# Patient Record
Sex: Male | Born: 1985 | Race: Black or African American | Hispanic: No | Marital: Single | Smoking: Current every day smoker
Health system: Southern US, Community
[De-identification: ages and names within clinical notes are randomized; demographics above are authoritative.]

## PROBLEM LIST (undated history)

## (undated) ENCOUNTER — Emergency Department (HOSPITAL_COMMUNITY): Admission: EM | Payer: No Typology Code available for payment source | Source: Home / Self Care

## (undated) DIAGNOSIS — S63509A Unspecified sprain of unspecified wrist, initial encounter: Secondary | ICD-10-CM

## (undated) DIAGNOSIS — J45909 Unspecified asthma, uncomplicated: Secondary | ICD-10-CM

## (undated) DIAGNOSIS — I10 Essential (primary) hypertension: Secondary | ICD-10-CM

## (undated) HISTORY — PX: MOUTH SURGERY: SHX715

---
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
## 2005-02-21 ENCOUNTER — Emergency Department (HOSPITAL_COMMUNITY): Admission: EM | Admit: 2005-02-21 | Discharge: 2005-02-21 | Payer: Self-pay | Admitting: Emergency Medicine

## 2007-01-12 ENCOUNTER — Emergency Department (HOSPITAL_COMMUNITY): Admission: EM | Admit: 2007-01-12 | Discharge: 2007-01-12 | Payer: Self-pay | Admitting: Emergency Medicine

## 2009-03-03 ENCOUNTER — Emergency Department (HOSPITAL_COMMUNITY): Admission: EM | Admit: 2009-03-03 | Discharge: 2009-03-03 | Payer: Self-pay | Admitting: Family Medicine

## 2009-04-04 ENCOUNTER — Emergency Department (HOSPITAL_COMMUNITY): Admission: EM | Admit: 2009-04-04 | Discharge: 2009-04-04 | Payer: Self-pay | Admitting: Family Medicine

## 2009-12-31 ENCOUNTER — Emergency Department (HOSPITAL_COMMUNITY): Admission: EM | Admit: 2009-12-31 | Discharge: 2009-12-31 | Payer: Self-pay | Admitting: Emergency Medicine

## 2010-02-18 ENCOUNTER — Emergency Department (HOSPITAL_COMMUNITY): Admission: EM | Admit: 2010-02-18 | Discharge: 2010-02-18 | Payer: Self-pay | Admitting: Emergency Medicine

## 2010-02-22 ENCOUNTER — Emergency Department (HOSPITAL_COMMUNITY): Admission: EM | Admit: 2010-02-22 | Discharge: 2010-02-23 | Payer: Self-pay | Admitting: Emergency Medicine

## 2010-10-03 ENCOUNTER — Emergency Department (HOSPITAL_COMMUNITY)
Admission: EM | Admit: 2010-10-03 | Discharge: 2010-10-03 | Disposition: A | Discharge status: Home / Self Care | Payer: Self-pay | Attending: Emergency Medicine | Admitting: Emergency Medicine

## 2010-10-03 DIAGNOSIS — R509 Fever, unspecified: Secondary | ICD-10-CM | POA: Insufficient documentation

## 2010-10-03 DIAGNOSIS — J029 Acute pharyngitis, unspecified: Secondary | ICD-10-CM | POA: Insufficient documentation

## 2010-10-13 LAB — DIFFERENTIAL
Basophils Absolute: 0 10*3/uL (ref 0.0–0.1)
Basophils Relative: 1 % (ref 0–1)
Eosinophils Relative: 1 % (ref 0–5)
Lymphs Abs: 1.8 10*3/uL (ref 0.7–4.0)
Monocytes Absolute: 0.7 10*3/uL (ref 0.1–1.0)

## 2010-10-13 LAB — URINE MICROSCOPIC-ADD ON

## 2010-10-13 LAB — URINALYSIS, ROUTINE W REFLEX MICROSCOPIC
Hgb urine dipstick: NEGATIVE
Ketones, ur: 15 mg/dL — AB
Urobilinogen, UA: 1 mg/dL (ref 0.0–1.0)

## 2010-10-13 LAB — CBC
MCH: 28.7 pg (ref 26.0–34.0)
MCHC: 33.3 g/dL (ref 30.0–36.0)
Platelets: 228 10*3/uL (ref 150–400)
RDW: 13.1 % (ref 11.5–15.5)
WBC: 9.3 10*3/uL (ref 4.0–10.5)

## 2010-10-13 LAB — BASIC METABOLIC PANEL
BUN: 9 mg/dL (ref 6–23)
CO2: 24 mEq/L (ref 19–32)
Chloride: 106 mEq/L (ref 96–112)
GFR calc non Af Amer: >60 mL/min (ref >60)
Potassium: 3.5 mEq/L (ref 3.5–5.1)
Sodium: 138 mEq/L (ref 135–145)

## 2010-12-11 ENCOUNTER — Emergency Department (HOSPITAL_COMMUNITY): Payer: Self-pay

## 2010-12-11 ENCOUNTER — Emergency Department (HOSPITAL_COMMUNITY)
Admission: EM | Admit: 2010-12-11 | Discharge: 2010-12-11 | Disposition: A | Discharge status: Home / Self Care | Payer: No Typology Code available for payment source | Attending: Emergency Medicine | Admitting: Emergency Medicine

## 2010-12-11 DIAGNOSIS — M542 Cervicalgia: Secondary | ICD-10-CM | POA: Insufficient documentation

## 2010-12-11 DIAGNOSIS — T148XXA Other injury of unspecified body region, initial encounter: Secondary | ICD-10-CM | POA: Insufficient documentation

## 2010-12-11 DIAGNOSIS — M549 Dorsalgia, unspecified: Secondary | ICD-10-CM | POA: Insufficient documentation

## 2010-12-11 DIAGNOSIS — R079 Chest pain, unspecified: Secondary | ICD-10-CM | POA: Insufficient documentation

## 2010-12-11 DIAGNOSIS — S139XXA Sprain of joints and ligaments of unspecified parts of neck, initial encounter: Secondary | ICD-10-CM | POA: Insufficient documentation

## 2011-03-09 ENCOUNTER — Emergency Department (HOSPITAL_COMMUNITY)
Admission: EM | Admit: 2011-03-09 | Discharge: 2011-03-09 | Disposition: A | Discharge status: Home / Self Care | Payer: Self-pay | Attending: Emergency Medicine | Admitting: Emergency Medicine

## 2011-03-09 ENCOUNTER — Emergency Department (HOSPITAL_COMMUNITY): Payer: Self-pay

## 2011-03-09 DIAGNOSIS — R059 Cough, unspecified: Secondary | ICD-10-CM | POA: Insufficient documentation

## 2011-03-09 DIAGNOSIS — J40 Bronchitis, not specified as acute or chronic: Secondary | ICD-10-CM | POA: Insufficient documentation

## 2011-03-09 DIAGNOSIS — R062 Wheezing: Secondary | ICD-10-CM | POA: Insufficient documentation

## 2011-03-09 DIAGNOSIS — M549 Dorsalgia, unspecified: Secondary | ICD-10-CM | POA: Insufficient documentation

## 2011-03-09 DIAGNOSIS — R0602 Shortness of breath: Secondary | ICD-10-CM | POA: Insufficient documentation

## 2011-03-09 DIAGNOSIS — F17200 Nicotine dependence, unspecified, uncomplicated: Secondary | ICD-10-CM | POA: Insufficient documentation

## 2011-03-09 DIAGNOSIS — R05 Cough: Secondary | ICD-10-CM | POA: Insufficient documentation

## 2011-03-09 DIAGNOSIS — R0609 Other forms of dyspnea: Secondary | ICD-10-CM | POA: Insufficient documentation

## 2011-03-09 DIAGNOSIS — R0989 Other specified symptoms and signs involving the circulatory and respiratory systems: Secondary | ICD-10-CM | POA: Insufficient documentation

## 2011-12-05 ENCOUNTER — Emergency Department (HOSPITAL_COMMUNITY)
Admission: EM | Admit: 2011-12-05 | Discharge: 2011-12-05 | Disposition: A | Discharge status: Home / Self Care | Payer: No Typology Code available for payment source | Attending: Emergency Medicine | Admitting: Emergency Medicine

## 2011-12-05 ENCOUNTER — Encounter (HOSPITAL_COMMUNITY): Payer: Self-pay | Admitting: Emergency Medicine

## 2011-12-05 ENCOUNTER — Emergency Department (HOSPITAL_COMMUNITY): Payer: No Typology Code available for payment source

## 2011-12-05 DIAGNOSIS — M542 Cervicalgia: Secondary | ICD-10-CM | POA: Insufficient documentation

## 2011-12-05 DIAGNOSIS — T1490XA Injury, unspecified, initial encounter: Secondary | ICD-10-CM | POA: Insufficient documentation

## 2011-12-05 DIAGNOSIS — F17200 Nicotine dependence, unspecified, uncomplicated: Secondary | ICD-10-CM | POA: Insufficient documentation

## 2011-12-05 DIAGNOSIS — I10 Essential (primary) hypertension: Secondary | ICD-10-CM | POA: Insufficient documentation

## 2011-12-05 DIAGNOSIS — R4182 Altered mental status, unspecified: Secondary | ICD-10-CM | POA: Insufficient documentation

## 2011-12-05 DIAGNOSIS — R51 Headache: Secondary | ICD-10-CM | POA: Insufficient documentation

## 2011-12-05 DIAGNOSIS — S0190XA Unspecified open wound of unspecified part of head, initial encounter: Secondary | ICD-10-CM | POA: Insufficient documentation

## 2011-12-05 HISTORY — DX: Essential (primary) hypertension: I10

## 2011-12-05 MED ORDER — HYDROMORPHONE HCL PF 1 MG/ML IJ SOLN
0.5000 mg | Freq: Once | INTRAMUSCULAR | Status: AC
  Administered 2011-12-05: 0.5 mg via INTRAVENOUS
  Filled 2011-12-05: qty 1

## 2011-12-05 MED ORDER — ONDANSETRON HCL 4 MG/2ML IJ SOLN
4.0000 mg | Freq: Once | INTRAMUSCULAR | Status: AC
  Administered 2011-12-05: 4 mg via INTRAVENOUS
  Filled 2011-12-05: qty 2

## 2011-12-05 MED ORDER — IBUPROFEN 800 MG PO TABS: 800.0000 mg | ORAL_TABLET | Freq: Three times a day (TID) | ORAL | Status: DC
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
## 2011-12-05 NOTE — ED Notes (Signed)
Pt returns to room 
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
## 2011-12-05 NOTE — ED Notes (Signed)
Dr Jeraldine Loots bedside, back board removed
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
## 2011-12-07 ENCOUNTER — Encounter (HOSPITAL_COMMUNITY): Payer: Self-pay

## 2011-12-07 ENCOUNTER — Emergency Department (HOSPITAL_COMMUNITY)
Admission: EM | Admit: 2011-12-07 | Discharge: 2011-12-07 | Disposition: A | Discharge status: Home / Self Care | Payer: No Typology Code available for payment source | Attending: Emergency Medicine | Admitting: Emergency Medicine

## 2011-12-07 DIAGNOSIS — S134XXA Sprain of ligaments of cervical spine, initial encounter: Secondary | ICD-10-CM

## 2011-12-07 DIAGNOSIS — F0781 Postconcussional syndrome: Secondary | ICD-10-CM

## 2011-12-07 DIAGNOSIS — Y93I9 Activity, other involving external motion: Secondary | ICD-10-CM | POA: Insufficient documentation

## 2011-12-07 DIAGNOSIS — S139XXA Sprain of joints and ligaments of unspecified parts of neck, initial encounter: Secondary | ICD-10-CM | POA: Insufficient documentation

## 2011-12-07 DIAGNOSIS — F17200 Nicotine dependence, unspecified, uncomplicated: Secondary | ICD-10-CM | POA: Insufficient documentation

## 2011-12-07 DIAGNOSIS — M545 Low back pain, unspecified: Secondary | ICD-10-CM | POA: Insufficient documentation

## 2011-12-07 DIAGNOSIS — Y998 Other external cause status: Secondary | ICD-10-CM | POA: Insufficient documentation

## 2011-12-07 DIAGNOSIS — I10 Essential (primary) hypertension: Secondary | ICD-10-CM | POA: Insufficient documentation

## 2011-12-07 MED ORDER — TRAMADOL HCL 50 MG PO TABS: 50.0000 mg | ORAL_TABLET | Freq: Four times a day (QID) | ORAL | Status: AC | PRN

## 2011-12-07 MED ORDER — ONDANSETRON HCL 4 MG PO TABS: 4.0000 mg | ORAL_TABLET | Freq: Four times a day (QID) | ORAL | Status: DC | PRN

## 2011-12-07 MED ORDER — DIAZEPAM 5 MG PO TABS: 5.0000 mg | ORAL_TABLET | Freq: Three times a day (TID) | ORAL | Status: AC | PRN
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
## 2011-12-09 NOTE — ED Provider Notes (Signed)
Medical screening examination/treatment/procedure(s) were performed by non-physician practitioner and as supervising physician I was immediately available for consultation/collaboration.  Quinnetta Roepke, MD 12/09/11 0744 

## 2011-12-12 ENCOUNTER — Encounter (HOSPITAL_COMMUNITY): Payer: Self-pay | Admitting: *Deleted

## 2011-12-12 ENCOUNTER — Emergency Department (HOSPITAL_COMMUNITY)
Admission: EM | Admit: 2011-12-12 | Discharge: 2011-12-12 | Discharge status: Against Medical Advice | Payer: Self-pay | Attending: Emergency Medicine | Admitting: Emergency Medicine

## 2011-12-12 DIAGNOSIS — R51 Headache: Secondary | ICD-10-CM | POA: Insufficient documentation

## 2011-12-12 NOTE — ED Notes (Signed)
Headache since the 9th of may no nv

## 2011-12-12 NOTE — ED Notes (Signed)
Pt left AMA signed form with Diplomatic Services operational officer

## 2012-11-08 ENCOUNTER — Encounter (HOSPITAL_COMMUNITY): Payer: Self-pay | Admitting: Emergency Medicine

## 2012-11-08 ENCOUNTER — Emergency Department (HOSPITAL_COMMUNITY)
Admission: EM | Admit: 2012-11-08 | Discharge: 2012-11-08 | Disposition: A | Discharge status: Home / Self Care | Payer: Self-pay | Attending: Emergency Medicine | Admitting: Emergency Medicine

## 2012-11-08 DIAGNOSIS — I10 Essential (primary) hypertension: Secondary | ICD-10-CM | POA: Insufficient documentation

## 2012-11-08 DIAGNOSIS — W268XXA Contact with other sharp object(s), not elsewhere classified, initial encounter: Secondary | ICD-10-CM | POA: Insufficient documentation

## 2012-11-08 DIAGNOSIS — Y929 Unspecified place or not applicable: Secondary | ICD-10-CM | POA: Insufficient documentation

## 2012-11-08 DIAGNOSIS — Y9389 Activity, other specified: Secondary | ICD-10-CM | POA: Insufficient documentation

## 2012-11-08 DIAGNOSIS — Z23 Encounter for immunization: Secondary | ICD-10-CM | POA: Insufficient documentation

## 2012-11-08 DIAGNOSIS — F17200 Nicotine dependence, unspecified, uncomplicated: Secondary | ICD-10-CM | POA: Insufficient documentation

## 2012-11-08 DIAGNOSIS — S61209A Unspecified open wound of unspecified finger without damage to nail, initial encounter: Secondary | ICD-10-CM | POA: Insufficient documentation

## 2012-11-08 DIAGNOSIS — IMO0002 Reserved for concepts with insufficient information to code with codable children: Secondary | ICD-10-CM

## 2012-11-08 MED ORDER — TETANUS-DIPHTH-ACELL PERTUSSIS 5-2.5-18.5 LF-MCG/0.5 IM SUSP
0.5000 mL | Freq: Once | INTRAMUSCULAR | Status: AC
  Administered 2012-11-08: 0.5 mL via INTRAMUSCULAR
  Filled 2012-11-08: qty 0.5

## 2012-11-08 NOTE — ED Provider Notes (Signed)
History     CSN: [id number]  Arrival date & time 11/08/12  4098   First MD Initiated Contact with Patient 11/08/12 [phone]      Chief Complaint  Patient presents with  . Laceration    (Consider location/radiation/quality/duration/timing/severity/associated sxs/prior treatment) HPI Comments: 27 y/o male presents to the ED with a laceration between his index and middle fingers of right hand after cutting it on his bed rail prior to arrival. States the metal part of his rail is sharp. Currently complaining of 7/10 throbbing pain worsened by separating his fingers. Denies numbness or tingling. Unsure of last tetanus shot.   Patient is a 27 y.o. male presenting with skin laceration. The history is provided by the patient.  Laceration   Past Medical History  Diagnosis Date  . Hypertension     History reviewed. No pertinent past surgical history.  History reviewed. No pertinent family history.  History  Substance Use Topics  . Smoking status: Current Every Day Smoker -- 1.00 packs/day    Types: Cigarettes  . Smokeless tobacco: Not on file  . Alcohol Use: No      Review of Systems  Skin: Positive for wound.  Neurological: Negative for numbness.  All other systems reviewed and are negative.    Allergies  Review of patient's allergies indicates no known allergies.  Home Medications  No current outpatient prescriptions on file.  BP 139/88  Pulse 89  Temp(Src) 98.2 F (36.8 C) (Oral)  Resp 16  SpO2 98%  Physical Exam  Nursing note and vitals reviewed. Constitutional: He is oriented to person, place, and time. He appears well-developed and well-nourished. No distress.  HENT:  Head: Normocephalic and atraumatic.  Eyes: Conjunctivae are normal.  Neck: Normal range of motion. Neck supple.  Cardiovascular: Normal rate, regular rhythm, normal heart sounds and intact distal pulses.   Pulmonary/Chest: Effort normal and breath sounds normal.  Musculoskeletal: Normal range  of motion. He exhibits no edema.  Full ROM of right hand and digits. No evidence of tendinous disruption. Full flexion and extension.   Neurological: He is alert and oriented to person, place, and time. No sensory deficit.  Sensation intact.  Skin: Skin is warm and dry.  1 cm laceration between index and middle finger of right hand. Capillary refill < 3 seconds.   Psychiatric: He has a normal mood and affect. His behavior is normal.    ED Course  Procedures (including critical care time) LACERATION REPAIR Performed by: Johnnette Gourd, Leo Rod PA student Authorized by: Johnnette Gourd Consent: Verbal consent obtained. Risks and benefits: risks, benefits and alternatives were discussed Consent given by: patient Patient identity confirmed: provided demographic data Prepped and Draped in normal sterile fashion Wound explored  Laceration Location: between right index and middle finger  Laceration Length: 1 cm  No Foreign Bodies seen or palpated  Anesthesia: local infiltration  Local anesthetic: lidocaine 2% without epinephrine  Anesthetic total: 2 ml  Irrigation method: syringe Amount of cleaning: standard  Skin closure: 2  Number of sutures: 5-0 prolene  Technique: simple interrupted  Patient tolerance: Patient tolerated the procedure well with no immediate complications.  Labs Reviewed - No data to display No results found.   1. Laceration       MDM  27 y/o male with laceration. Wound care given. 2 sutures placed. Neurovascularly intact. No tendon injury. TDAP given. Return precautions discussed. Patient states understanding of plan and is agreeable.         Nada Boozer  Mathis Fare, PA-C 11/08/12 [phone]

## 2012-11-08 NOTE — ED Notes (Signed)
Right hand cleaned with betadine and NS

## 2012-11-08 NOTE — ED Notes (Signed)
Patient advises that he cut his hand over the bed rail this morning. Laceration noted between the index and middle finger.No active bleeding noted. Positive PMS noted to the hand.

## 2012-11-08 NOTE — ED Provider Notes (Signed)
Medical screening examination/treatment/procedure(s) were performed by non-physician practitioner and as supervising physician I was immediately available for consultation/collaboration.  I was available if needed during laceration repair.  Gavin Pound. Oletta Lamas, MD 11/08/12 1610

## 2012-11-08 NOTE — ED Notes (Signed)
Patient discharged using the teach back method and patient verbalizes an understanding

## 2013-07-17 ENCOUNTER — Emergency Department (HOSPITAL_COMMUNITY)
Admission: EM | Admit: 2013-07-17 | Discharge: 2013-07-17 | Disposition: A | Discharge status: Home / Self Care | Payer: Medicaid Other | Attending: Emergency Medicine | Admitting: Emergency Medicine

## 2013-07-17 ENCOUNTER — Encounter (HOSPITAL_COMMUNITY): Payer: Self-pay | Admitting: Emergency Medicine

## 2013-07-17 DIAGNOSIS — Y939 Activity, unspecified: Secondary | ICD-10-CM | POA: Insufficient documentation

## 2013-07-17 DIAGNOSIS — I10 Essential (primary) hypertension: Secondary | ICD-10-CM | POA: Insufficient documentation

## 2013-07-17 DIAGNOSIS — X500XXA Overexertion from strenuous movement or load, initial encounter: Secondary | ICD-10-CM | POA: Insufficient documentation

## 2013-07-17 DIAGNOSIS — S335XXA Sprain of ligaments of lumbar spine, initial encounter: Secondary | ICD-10-CM | POA: Insufficient documentation

## 2013-07-17 DIAGNOSIS — F17200 Nicotine dependence, unspecified, uncomplicated: Secondary | ICD-10-CM | POA: Insufficient documentation

## 2013-07-17 DIAGNOSIS — Y929 Unspecified place or not applicable: Secondary | ICD-10-CM | POA: Insufficient documentation

## 2013-07-17 DIAGNOSIS — S39012A Strain of muscle, fascia and tendon of lower back, initial encounter: Secondary | ICD-10-CM

## 2013-07-17 MED ORDER — OXYCODONE-ACETAMINOPHEN 5-325 MG PO TABS: 2.0000 | ORAL_TABLET | ORAL | Status: DC | PRN

## 2013-07-17 MED ORDER — CYCLOBENZAPRINE HCL 10 MG PO TABS: 10.0000 mg | ORAL_TABLET | Freq: Two times a day (BID) | ORAL | Status: DC | PRN

## 2013-07-17 MED ORDER — MELOXICAM 7.5 MG PO TABS: 15.0000 mg | ORAL_TABLET | Freq: Every day | ORAL | Status: DC

## 2013-07-17 MED ORDER — OXYCODONE-ACETAMINOPHEN 5-325 MG PO TABS
2.0000 | ORAL_TABLET | Freq: Once | ORAL | Status: AC
  Administered 2013-07-17: 2 via ORAL
  Filled 2013-07-17: qty 2

## 2013-07-17 NOTE — ED Notes (Signed)
Pt c/o lower back pain. Denies trauma. 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

## 2013-07-17 NOTE — ED Provider Notes (Signed)
CSN: [id number]     Arrival date & time 07/17/13  2300 History   First MD Initiated Contact with Patient 07/17/13 2314     Chief Complaint  Patient presents with  . Back Pain   (Consider location/radiation/quality/duration/timing/severity/associated sxs/prior Treatment) HPI Comments: Patient is a 27 year old male with no significant past medical history who presents today for low back pain x2 weeks. Patient states the pain is worse when twisting his back and relieved with rest. He describes the pain as aching and throbbing in nature and states it is nonradiating. He denies any trauma or injury to his back as well as any associated fever, abdominal pain, vomiting, urinary symptoms, bowel or bladder incontinence, saddle anesthesia, perianal numbness, inability to ambulate, or weakness. Patient states that he does frequent heavy lifting as he works at [employer].  Patient is a 27 y.o. male presenting with back pain. The history is provided by the patient. No language interpreter was used.  Back Pain   Past Medical History  Diagnosis Date  . Hypertension    History reviewed. No pertinent past surgical history. History reviewed. No pertinent family history. History  Substance Use Topics  . Smoking status: Current Every Day Smoker -- 1.00 packs/day    Types: Cigarettes  . Smokeless tobacco: Never Used  . Alcohol Use: No    Review of Systems  Musculoskeletal: Positive for back pain.  All other systems reviewed and are negative.    Allergies  Review of patient's allergies indicates no known allergies.  Home Medications   Current Outpatient Rx  Name  Route  Sig  Dispense  Refill  . ibuprofen (ADVIL,MOTRIN) 200 MG tablet   Oral   Take 200 mg by mouth every 6 (six) hours as needed for mild pain.         . cyclobenzaprine (FLEXERIL) 10 MG tablet   Oral   Take 1 tablet (10 mg total) by mouth 2 (two) times daily as needed for muscle spasms.   20 tablet   0   . meloxicam (MOBIC) 7.5  MG tablet   Oral   Take 2 tablets (15 mg total) by mouth daily.   30 tablet   0   . oxyCODONE-acetaminophen (PERCOCET/ROXICET) 5-325 MG per tablet   Oral   Take 2 tablets by mouth every 4 (four) hours as needed for severe pain.   6 tablet   0    BP 144/71  Pulse 89  Temp(Src) 98.4 F (36.9 C) (Oral)  Resp 18  Ht 5\' 8"  (1.727 m)  Wt 177 lb 3.2 oz (80.377 kg)  BMI 26.95 kg/m2  SpO2 96%  Physical Exam  Nursing note and vitals reviewed. Constitutional: He is oriented to person, place, and time. He appears well-developed and well-nourished. No distress.  HENT:  Head: Normocephalic and atraumatic.  Eyes: Conjunctivae and EOM are normal. No scleral icterus.  Neck: Normal range of motion.  Pulmonary/Chest: Effort normal. No respiratory distress.  Musculoskeletal: Normal range of motion.  Tenderness to palpation of the bilateral lumbar paraspinal muscles. No midline tenderness to palpation of the thoracic or lumbosacral spine. No bony deformities or step-offs palpated. Normal range of motion of the back with flexion, extension, and twisting.  Neurological: He is alert and oriented to person, place, and time. He has normal reflexes.  No gross sensory deficits appreciated. Patient ambulatory with normal gait. Patellar and Achilles reflexes 2+ bilaterally.  Skin: Skin is warm and dry. No rash noted. He is not diaphoretic. No erythema. No  pallor.  Psychiatric: He has a normal mood and affect. His behavior is normal.    ED Course  Procedures (including critical care time) Labs Review Labs Reviewed - No data to display Imaging Review No results found.  EKG Interpretation   None       MDM   1. Low back strain, initial encounter    Uncomplicated low-back strain. Patient well and nontoxic appearing, hemodynamically stable, and afebrile. He denies any trauma or injury to his back inciting his pain. Physical exam findings as above. Patient neurovascularly intact and ambulatory  with normal gait. No reflexive signs concerning for cauda equina. No history of cancer or IV drug use. Do not believe emergent imaging is indicated at this time. Patient stable and appropriate for discharge with prescriptions for Flexeril and Mobic. Ice to the affected area advised. Return precautions discussed and patient agreeable to plan with no unaddressed concerns.    Antony Madura, [place] 07/17/13 2335

## 2013-07-18 NOTE — ED Provider Notes (Signed)
Medical screening examination/treatment/procedure(s) were performed by non-physician practitioner and as supervising physician I was immediately available for consultation/collaboration.  EKG Interpretation   None         Brandt Loosen, MD 07/18/13 [phone]

## 2013-11-03 ENCOUNTER — Emergency Department (HOSPITAL_COMMUNITY)
Admission: EM | Admit: 2013-11-03 | Discharge: 2013-11-03 | Disposition: A | Discharge status: Home / Self Care | Payer: Medicaid Other | Attending: Emergency Medicine | Admitting: Emergency Medicine

## 2013-11-03 DIAGNOSIS — R51 Headache: Secondary | ICD-10-CM | POA: Insufficient documentation

## 2013-11-03 DIAGNOSIS — T85848A Pain due to other internal prosthetic devices, implants and grafts, initial encounter: Secondary | ICD-10-CM

## 2013-11-03 DIAGNOSIS — F17200 Nicotine dependence, unspecified, uncomplicated: Secondary | ICD-10-CM | POA: Insufficient documentation

## 2013-11-03 DIAGNOSIS — Z792 Long term (current) use of antibiotics: Secondary | ICD-10-CM | POA: Insufficient documentation

## 2013-11-03 DIAGNOSIS — G8918 Other acute postprocedural pain: Secondary | ICD-10-CM | POA: Insufficient documentation

## 2013-11-03 DIAGNOSIS — I10 Essential (primary) hypertension: Secondary | ICD-10-CM | POA: Insufficient documentation

## 2013-11-03 DIAGNOSIS — G44209 Tension-type headache, unspecified, not intractable: Secondary | ICD-10-CM

## 2013-11-03 LAB — BASIC METABOLIC PANEL
BUN: 12 mg/dL (ref 6–23)
CO2: 22 mEq/L (ref 19–32)
CREATININE: 1.13 mg/dL (ref 0.50–1.35)
Calcium: 9.1 mg/dL (ref 8.4–10.5)
Chloride: 105 mEq/L (ref 96–112)
GFR calc Af Amer: >90 mL/min (ref >90)
GFR, EST NON AFRICAN AMERICAN: 88 mL/min — AB (ref >90)
Glucose, Bld: 87 mg/dL (ref 70–99)
Potassium: 4.3 mEq/L (ref 3.7–5.3)
SODIUM: 140 meq/L (ref 137–147)

## 2013-11-03 LAB — CBC WITH DIFFERENTIAL/PLATELET
BASOS ABS: 0 10*3/uL (ref 0.0–0.1)
Basophils Relative: 0 % (ref 0–1)
EOS ABS: 0.1 10*3/uL (ref 0.0–0.7)
Eosinophils Relative: 1 % (ref 0–5)
HEMATOCRIT: 43.5 % (ref 39.0–52.0)
Hemoglobin: 14.6 g/dL (ref 13.0–17.0)
Lymphocytes Relative: 33 % (ref 12–46)
Lymphs Abs: 4.7 10*3/uL — ABNORMAL HIGH (ref 0.7–4.0)
MCH: 28.7 pg (ref 26.0–34.0)
MCHC: 33.6 g/dL (ref 30.0–36.0)
MCV: 85.6 fL (ref 78.0–100.0)
MONO ABS: 1.1 10*3/uL — AB (ref 0.1–1.0)
Monocytes Relative: 8 % (ref 3–12)
Neutro Abs: 8.2 10*3/uL — ABNORMAL HIGH (ref 1.7–7.7)
Neutrophils Relative %: 58 % (ref 43–77)
PLATELETS: 283 10*3/uL (ref 150–400)
RBC: 5.08 MIL/uL (ref 4.22–5.81)
RDW: 12.6 % (ref 11.5–15.5)
WBC: 14.1 10*3/uL — ABNORMAL HIGH (ref 4.0–10.5)

## 2013-11-03 MED ORDER — NAPROXEN 500 MG PO TABS: 500.0000 mg | ORAL_TABLET | Freq: Two times a day (BID) | ORAL | Status: DC

## 2013-11-03 MED ORDER — DIPHENHYDRAMINE HCL 50 MG/ML IJ SOLN
25.0000 mg | Freq: Once | INTRAMUSCULAR | Status: AC
  Administered 2013-11-03: 25 mg via INTRAVENOUS
  Filled 2013-11-03: qty 1

## 2013-11-03 MED ORDER — METOCLOPRAMIDE HCL 5 MG/ML IJ SOLN
10.0000 mg | Freq: Once | INTRAMUSCULAR | Status: AC
  Administered 2013-11-03: 10 mg via INTRAVENOUS
  Filled 2013-11-03: qty 2

## 2013-11-03 MED ORDER — KETOROLAC TROMETHAMINE 30 MG/ML IJ SOLN
30.0000 mg | Freq: Once | INTRAMUSCULAR | Status: AC
  Administered 2013-11-03: 30 mg via INTRAVENOUS
  Filled 2013-11-03: qty 1

## 2013-11-03 MED ORDER — METOCLOPRAMIDE HCL 10 MG PO TABS: 10.0000 mg | ORAL_TABLET | Freq: Four times a day (QID) | ORAL | Status: DC | PRN

## 2013-11-03 MED ORDER — SODIUM CHLORIDE 0.9 % IV SOLN
1000.0000 mL | INTRAVENOUS | Status: DC
  Administered 2013-11-03: 1000 mL via INTRAVENOUS

## 2013-11-03 MED ORDER — SODIUM CHLORIDE 0.9 % IV SOLN
1000.0000 mL | Freq: Once | INTRAVENOUS | Status: AC
  Administered 2013-11-03: 1000 mL via INTRAVENOUS

## 2013-11-03 NOTE — ED Provider Notes (Signed)
CSN: [id number]     Arrival date & time 11/03/13  0321 History   First MD Initiated Contact with Patient 11/03/13 [phone]     Chief Complaint  Patient presents with  . Dental Pain     (Consider location/radiation/quality/duration/timing/severity/associated sxs/prior Treatment) Patient is a 28 y.o. male presenting with tooth pain. The history is provided by the patient.  Dental Pain He had 4 wisdom teeth and 2 other teeth extracted 2 days ago. He was discharged with a prescription for hydrocodone and acetaminophen and amoxicillin. He is complaining of pain in his mouth that the pain medication does not help. It makes him sleepy but is still in pain. He is also complaining of a headache. Pain is rated at 10/10. There is no fever or chills but he has broken out in sweats. There is no nausea or vomiting. He denies any vision change.  Past Medical History  Diagnosis Date  . Hypertension    No past surgical history on file. No family history on file. History  Substance Use Topics  . Smoking status: Current Every Day Smoker -- 1.00 packs/day    Types: Cigarettes  . Smokeless tobacco: Never Used  . Alcohol Use: No    Review of Systems  All other systems reviewed and are negative.     Allergies  Review of patient's allergies indicates no known allergies.  Home Medications   Current Outpatient Rx  Name  Route  Sig  Dispense  Refill  . amoxicillin (AMOXIL) 500 MG tablet   Oral   Take 500 mg by mouth 3 (three) times daily. For wisdom teeth extraction         . HYDROcodone-acetaminophen (NORCO) 10-325 MG per tablet   Oral   Take 1 tablet by mouth every 4 (four) hours as needed for moderate pain or severe pain (dental extractions).          There were no vitals taken for this visit. Physical Exam  Nursing note and vitals reviewed.  28 year old male, who appears uncomfortable, but is in no acute distress. Vital signs are normal. Oxygen saturation is 97%, which is normal. Head  is normocephalic and atraumatic. PERRLA, EOMI. Oropharynx is clear. Teeth #1, 3, 16, 17, 19, 32 have been recently extracted. There is slight bleeding from the socket of tooth #19. None appear clinically infected. Neck has marked spasm of sternocleidomastoid muscles and paracervical muscles. These are mildly tender. However, neck is still supple without adenopathy or JVD. Back is nontender and there is no CVA tenderness. Lungs are clear without rales, wheezes, or rhonchi. Chest is nontender. Heart has regular rate and rhythm without murmur. Abdomen is soft, flat, nontender without masses or hepatosplenomegaly and peristalsis is normoactive. Extremities have no cyanosis or edema, full range of motion is present. Skin is warm and dry without rash. Neurologic: Mental status is normal, cranial nerves are intact, there are no motor or sensory deficits.  ED Course  Procedures (including critical care time) Labs Review Results for orders placed during the hospital encounter of 11/03/13  CBC WITH DIFFERENTIAL      Result Value Ref Range   WBC 14.1 (*) 4.0 - 10.5 K/uL   RBC 5.08  4.22 - 5.81 MIL/uL   Hemoglobin 14.6  13.0 - 17.0 g/dL   HCT 11.9  14.7 - 82.9 %   MCV 85.6  78.0 - 100.0 fL   MCH 28.7  26.0 - 34.0 pg   MCHC 33.6  30.0 - 36.0 g/dL  RDW 12.6  11.5 - 15.5 %   Platelets 283  150 - 400 K/uL   Neutrophils Relative % 58  43 - 77 %   Neutro Abs 8.2 (*) 1.7 - 7.7 K/uL   Lymphocytes Relative 33  12 - 46 %   Lymphs Abs 4.7 (*) 0.7 - 4.0 K/uL   Monocytes Relative 8  3 - 12 %   Monocytes Absolute 1.1 (*) 0.1 - 1.0 K/uL   Eosinophils Relative 1  0 - 5 %   Eosinophils Absolute 0.1  0.0 - 0.7 K/uL   Basophils Relative 0  0 - 1 %   Basophils Absolute 0.0  0.0 - 0.1 K/uL  BASIC METABOLIC PANEL      Result Value Ref Range   Sodium 140  137 - 147 mEq/L   Potassium 4.3  3.7 - 5.3 mEq/L   Chloride 105  96 - 112 mEq/L   CO2 22  19 - 32 mEq/L   Glucose, Bld 87  70 - 99 mg/dL   BUN 12  6 - 23  mg/dL   Creatinine, Ser 3.081.13  0.50 - 1.35 mg/dL   Calcium 9.1  8.4 - 65.710.5 mg/dL   GFR calc non Af Amer 88 (*) >90 mL/min   GFR calc Af Amer >90  >90 mL/min   MDM   Final diagnoses:  Dental implant pain  Muscle contraction headache    Dental pain post extraction. No evidence of infection. Headache is at least partially in muscle contraction headache. He'll be given IV fluids and IV metoclopramide and diphenhydramine. He also be given IV ketorolac.  He feels much better after above noted treatment. WBC is mildly elevated but without left shift. There is no evidence of serious infection. This was explained to the patient. He is discharged with prescription for naproxen and metoclopramide. He is to continue taking his antibiotic and followup with his dentist.  Dione Boozeavid Lexandra Rettke, MD 11/03/13 [phone]

## 2013-11-03 NOTE — ED Notes (Signed)
Patient arrives with Dental pain related tooth removal. Patient had 2 molar teeth and all 4 wisdom teeth removed on 11/01/2013. Has been taking vicodin as prescribed, but medicine hasn't been helping. Also currently taking amoxicillin.

## 2013-11-03 NOTE — ED Notes (Signed)
See downtime paperwork for additional details about Triage.

## 2013-11-03 NOTE — Discharge Instructions (Signed)
Follow up with the dentist. Continue taking your antibiotic. Use ice and/or heat to the muscles in your neck if they go into spasm.   Tension Headache A tension headache is a feeling of pain, pressure, or aching often felt over the front and sides of the head. The pain can be dull or can feel tight (constricting). It is the most common type of headache. Tension headaches are not normally associated with nausea or vomiting and do not get worse with physical activity. Tension headaches can last 30 minutes to several days.  CAUSES  The exact cause is not known, but it may be caused by chemicals and hormones in the brain that lead to pain. Tension headaches often begin after stress, anxiety, or depression. Other triggers may include:  Alcohol.  Caffeine (too much or withdrawal).  Respiratory infections (colds, flu, sinus infections).  Dental problems or teeth clenching.  Fatigue.  Holding your head and neck in one position too long while using a computer. SYMPTOMS   Pressure around the head.   Dull, aching head pain.   Pain felt over the front and sides of the head.   Tenderness in the muscles of the head, neck, and shoulders. DIAGNOSIS  A tension headache is often diagnosed based on:   Symptoms.   Physical examination.   A CT scan or MRI of your head. These tests may be ordered if symptoms are severe or unusual. TREATMENT  Medicines may be given to help relieve symptoms.  HOME CARE INSTRUCTIONS   Only take over-the-counter or prescription medicines for pain or discomfort as directed by your caregiver.   Lie down in a dark, quiet room when you have a headache.   Keep a journal to find out what may be triggering your headaches. For example, write down:  What you eat and drink.  How much sleep you get.  Any change to your diet or medicines.  Try massage or other relaxation techniques.   Ice packs or heat applied to the head and neck can be used. Use these 3 to  4 times per day for 15 to 20 minutes each time, or as needed.   Limit stress.   Sit up straight, and do not tense your muscles.   Quit smoking if you smoke.  Limit alcohol use.  Decrease the amount of caffeine you drink, or stop drinking caffeine.  Eat and exercise regularly.  Get 7 to 9 hours of sleep, or as recommended by your caregiver.  Avoid excessive use of pain medicine as recurrent headaches can occur.  SEEK MEDICAL CARE IF:   You have problems with the medicines you were prescribed.  Your medicines do not work.  You have a change from the usual headache.  You have nausea or vomiting. SEEK IMMEDIATE MEDICAL CARE IF:   Your headache becomes severe.  You have a fever.  You have a stiff neck.  You have loss of vision.  You have muscular weakness or loss of muscle control.  You lose your balance or have trouble walking.  You feel faint or pass out.  You have severe symptoms that are different from your first symptoms. MAKE SURE YOU:   Understand these instructions.  Will watch your condition.  Will get help right away if you are not doing well or get worse. Document Released: 07/15/2005 Document Revised: 10/07/2011 Document Reviewed: 07/05/2011 [facility] Patient Information 2014 [place], [place].  Metoclopramide tablets What is this medicine? METOCLOPRAMIDE (met oh kloe PRA mide) is used to  treat the symptoms of gastroesophageal reflux disease (GERD) like heartburn. It is also used to treat people with slow emptying of the stomach and intestinal tract. This medicine may be used for other purposes; ask your health care provider or pharmacist if you have questions. COMMON BRAND NAME(S): Reglan What should I tell my health care provider before I take this medicine? They need to know if you have any of these conditions: -breast cancer -depression -diabetes -heart failure -high blood pressure -kidney disease -liver disease -Parkinson's disease or a  movement disorder -pheochromocytoma -seizures -stomach obstruction, bleeding, or perforation -an unusual or allergic reaction to metoclopramide, procainamide, sulfites, other medicines, foods, dyes, or preservatives -pregnant or trying to get pregnant -breast-feeding How should I use this medicine? Take this medicine by mouth with a glass of water. Follow the directions on the prescription label. Take this medicine on an empty stomach, about 30 minutes before eating. Take your doses at regular intervals. Do not take your medicine more often than directed. Do not stop taking except on the advice of your doctor or health care professional. A special MedGuide will be given to you by the pharmacist with each prescription and refill. Be sure to read this information carefully each time. Talk to your pediatrician regarding the use of this medicine in children. Special care may be needed. Overdosage: If you think you have taken too much of this medicine contact a poison control center or emergency room at once. NOTE: This medicine is only for you. Do not share this medicine with others. What if I miss a dose? If you miss a dose, take it as soon as you can. If it is almost time for your next dose, take only that dose. Do not take double or extra doses. What may interact with this medicine? -acetaminophen -cyclosporine -digoxin -medicines for blood pressure -medicines for diabetes, including insulin -medicines for hay fever and other allergies -medicines for depression, especially an Monoamine Oxidase Inhibitor (MAOI) -medicines for Parkinson's disease, like levodopa -medicines for sleep or for pain -tetracycline This list may not describe all possible interactions. Give your health care provider a list of all the medicines, herbs, non-prescription drugs, or dietary supplements you use. Also tell them if you smoke, drink alcohol, or use illegal drugs. Some items may interact with your  medicine. What should I watch for while using this medicine? It may take a few weeks for your stomach condition to start to get better. However, do not take this medicine for longer than 12 weeks. The longer you take this medicine, and the more you take it, the greater your chances are of developing serious side effects. If you are an elderly patient, a male patient, or you have diabetes, you may be at an increased risk for side effects from this medicine. Contact your doctor immediately if you start having movements you cannot control such as lip smacking, rapid movements of the tongue, involuntary or uncontrollable movements of the eyes, head, arms and legs, or muscle twitches and spasms. Patients and their families should watch out for worsening depression or thoughts of suicide. Also watch out for any sudden or severe changes in feelings such as feeling anxious, agitated, panicky, irritable, hostile, aggressive, impulsive, severely restless, overly excited and hyperactive, or not being able to sleep. If this happens, especially at the beginning of treatment or after a change in dose, call your doctor. Do not treat yourself for high fever. Ask your doctor or health care professional for advice. You may  get drowsy or dizzy. Do not drive, use machinery, or do anything that needs mental alertness until you know how this drug affects you. Do not stand or sit up quickly, especially if you are an older patient. This reduces the risk of dizzy or fainting spells. Alcohol can make you more drowsy and dizzy. Avoid alcoholic drinks. What side effects may I notice from receiving this medicine? Side effects that you should report to your doctor or health care professional as soon as possible: -allergic reactions like skin rash, itching or hives, swelling of the face, lips, or tongue -abnormal production of milk in females -breast enlargement in both males and females -change in the way you walk -difficulty  moving, speaking or swallowing -drooling, lip smacking, or rapid movements of the tongue -excessive sweating -fever -involuntary or uncontrollable movements of the eyes, head, arms and legs -irregular heartbeat or palpitations -muscle twitches and spasms -unusually weak or tired Side effects that usually do not require medical attention (report to your doctor or health care professional if they continue or are bothersome): -change in sex drive or performance -depressed mood -diarrhea -difficulty sleeping -headache -menstrual changes -restless or nervous This list may not describe all possible side effects. Call your doctor for medical advice about side effects. You may report side effects to FDA at 1-800-FDA-1088. Where should I keep my medicine? Keep out of the reach of children. Store at room temperature between 20 and 25 degrees C (68 and 77 degrees F). Protect from light. Keep container tightly closed. Throw away any unused medicine after the expiration date. NOTE: This sheet is a summary. It may not cover all possible information. If you have questions about this medicine, talk to your doctor, pharmacist, or health care provider.  2014, Elsevier/Gold Standard. (2011-11-12 13:04:38)  Naproxen and naproxen sodium oral immediate-release tablets What is this medicine? NAPROXEN (na PROX en) is a non-steroidal anti-inflammatory drug (NSAID). It is used to reduce swelling and to treat pain. This medicine may be used for dental pain, headache, or painful monthly periods. It is also used for painful joint and muscular problems such as arthritis, tendinitis, bursitis, and gout. This medicine may be used for other purposes; ask your health care provider or pharmacist if you have questions. COMMON BRAND NAME(S): Aflaxen, Aleve Arthritis, Aleve, All Day Relief, Anaprox DS, Anaprox, Naprosyn What should I tell my health care provider before I take this medicine? They need to know if you have any  of these conditions: -asthma -cigarette smoker -drink more than 3 alcohol containing drinks a day -heart disease or circulation problems such as heart failure or leg edema (fluid retention) -high blood pressure -kidney disease -liver disease -stomach bleeding or ulcers -an unusual or allergic reaction to naproxen, aspirin, other NSAIDs, other medicines, foods, dyes, or preservatives -pregnant or trying to get pregnant -breast-feeding How should I use this medicine? Take this medicine by mouth with a glass of water. Follow the directions on the prescription label. Take it with food if your stomach gets upset. Try to not lie down for at least 10 minutes after you take it. Take your medicine at regular intervals. Do not take your medicine more often than directed. Long-term, continuous use may increase the risk of heart attack or stroke. A special MedGuide will be given to you by the pharmacist with each prescription and refill. Be sure to read this information carefully each time. Talk to your pediatrician regarding the use of this medicine in children. Special care may be  needed. Overdosage: If you think you have taken too much of this medicine contact a poison control center or emergency room at once. NOTE: This medicine is only for you. Do not share this medicine with others. What if I miss a dose? If you miss a dose, take it as soon as you can. If it is almost time for your next dose, take only that dose. Do not take double or extra doses. What may interact with this medicine? -alcohol -aspirin -cidofovir -diuretics -lithium -methotrexate -other drugs for inflammation like ketorolac or prednisone -pemetrexed -probenecid -warfarin This list may not describe all possible interactions. Give your health care provider a list of all the medicines, herbs, non-prescription drugs, or dietary supplements you use. Also tell them if you smoke, drink alcohol, or use illegal drugs. Some items may  interact with your medicine. What should I watch for while using this medicine? Tell your doctor or health care professional if your pain does not get better. Talk to your doctor before taking another medicine for pain. Do not treat yourself. This medicine does not prevent heart attack or stroke. In fact, this medicine may increase the chance of a heart attack or stroke. The chance may increase with longer use of this medicine and in people who have heart disease. If you take aspirin to prevent heart attack or stroke, talk with your doctor or health care professional. Do not take other medicines that contain aspirin, ibuprofen, or naproxen with this medicine. Side effects such as stomach upset, nausea, or ulcers may be more likely to occur. Many medicines available without a prescription should not be taken with this medicine. This medicine can cause ulcers and bleeding in the stomach and intestines at any time during treatment. Do not smoke cigarettes or drink alcohol. These increase irritation to your stomach and can make it more susceptible to damage from this medicine. Ulcers and bleeding can happen without warning symptoms and can cause death. You may get drowsy or dizzy. Do not drive, use machinery, or do anything that needs mental alertness until you know how this medicine affects you. Do not stand or sit up quickly, especially if you are an older patient. This reduces the risk of dizzy or fainting spells. This medicine can cause you to bleed more easily. Try to avoid damage to your teeth and gums when you brush or floss your teeth. What side effects may I notice from receiving this medicine? Side effects that you should report to your doctor or health care professional as soon as possible: -black or bloody stools, blood in the urine or vomit -blurred vision -chest pain -difficulty breathing or wheezing -nausea or vomiting -severe stomach pain -skin rash, skin redness, blistering or peeling  skin, hives, or itching -slurred speech or weakness on one side of the body -swelling of eyelids, throat, lips -unexplained weight gain or swelling -unusually weak or tired -yellowing of eyes or skin Side effects that usually do not require medical attention (report to your doctor or health care professional if they continue or are bothersome): -constipation -headache -heartburn This list may not describe all possible side effects. Call your doctor for medical advice about side effects. You may report side effects to FDA at 1-800-FDA-1088. Where should I keep my medicine? Keep out of the reach of children. Store at room temperature between 15 and 30 degrees C (59 and 86 degrees F). Keep container tightly closed. Throw away any unused medicine after the expiration date. NOTE: This sheet is a  summary. It may not cover all possible information. If you have questions about this medicine, talk to your doctor, pharmacist, or health care provider.  2014, Elsevier/Gold Standard. (2009-07-17 20:10:16)

## 2013-11-13 ENCOUNTER — Emergency Department (HOSPITAL_COMMUNITY)
Admission: EM | Admit: 2013-11-13 | Discharge: 2013-11-13 | Disposition: A | Discharge status: Home / Self Care | Payer: Medicaid Other | Attending: Emergency Medicine | Admitting: Emergency Medicine

## 2013-11-13 ENCOUNTER — Encounter (HOSPITAL_COMMUNITY): Payer: Self-pay | Admitting: Emergency Medicine

## 2013-11-13 DIAGNOSIS — Z792 Long term (current) use of antibiotics: Secondary | ICD-10-CM | POA: Insufficient documentation

## 2013-11-13 DIAGNOSIS — Z791 Long term (current) use of non-steroidal anti-inflammatories (NSAID): Secondary | ICD-10-CM | POA: Insufficient documentation

## 2013-11-13 DIAGNOSIS — G8911 Acute pain due to trauma: Secondary | ICD-10-CM | POA: Insufficient documentation

## 2013-11-13 DIAGNOSIS — M545 Low back pain, unspecified: Secondary | ICD-10-CM | POA: Insufficient documentation

## 2013-11-13 DIAGNOSIS — K0889 Other specified disorders of teeth and supporting structures: Secondary | ICD-10-CM

## 2013-11-13 DIAGNOSIS — I10 Essential (primary) hypertension: Secondary | ICD-10-CM | POA: Insufficient documentation

## 2013-11-13 DIAGNOSIS — G8929 Other chronic pain: Secondary | ICD-10-CM | POA: Insufficient documentation

## 2013-11-13 DIAGNOSIS — F17200 Nicotine dependence, unspecified, uncomplicated: Secondary | ICD-10-CM | POA: Insufficient documentation

## 2013-11-13 DIAGNOSIS — R51 Headache: Secondary | ICD-10-CM | POA: Insufficient documentation

## 2013-11-13 DIAGNOSIS — K089 Disorder of teeth and supporting structures, unspecified: Secondary | ICD-10-CM | POA: Insufficient documentation

## 2013-11-13 MED ORDER — METHOCARBAMOL 500 MG PO TABS
500.0000 mg | ORAL_TABLET | Freq: Once | ORAL | Status: AC
  Administered 2013-11-13: 500 mg via ORAL
  Filled 2013-11-13: qty 1

## 2013-11-13 MED ORDER — KETOROLAC TROMETHAMINE 60 MG/2ML IM SOLN
60.0000 mg | Freq: Once | INTRAMUSCULAR | Status: AC
  Administered 2013-11-13: 60 mg via INTRAMUSCULAR
  Filled 2013-11-13: qty 2

## 2013-11-13 MED ORDER — HYDROCODONE-ACETAMINOPHEN 5-325 MG PO TABS: 1.0000 | ORAL_TABLET | ORAL | Status: DC | PRN

## 2013-11-13 MED ORDER — METHOCARBAMOL 500 MG PO TABS: 500.0000 mg | ORAL_TABLET | Freq: Three times a day (TID) | ORAL | Status: DC | PRN

## 2013-11-13 MED ORDER — IBUPROFEN 600 MG PO TABS: 600.0000 mg | ORAL_TABLET | Freq: Four times a day (QID) | ORAL | Status: DC | PRN

## 2013-11-13 NOTE — ED Notes (Signed)
Pt c/o mouth pain from oral surgery 4/6. Pt also c/o chronic back pain

## 2013-11-13 NOTE — Discharge Instructions (Signed)
Chronic Back Pain  When back pain lasts longer than 3 months, it is called chronic back pain.People with chronic back pain often go through certain periods that are more intense (flare-ups).  CAUSES Chronic back pain can be caused by wear and tear (degeneration) on different structures in your back. These structures include:  The bones of your spine (vertebrae) and the joints surrounding your spinal cord and nerve roots (facets).  The strong, fibrous tissues that connect your vertebrae (ligaments). Degeneration of these structures may result in pressure on your nerves. This can lead to constant pain. HOME CARE INSTRUCTIONS  Avoid bending, heavy lifting, prolonged sitting, and activities which make the problem worse.  Take brief periods of rest throughout the day to reduce your pain. Lying down or standing usually is better than sitting while you are resting.  Take over-the-counter or prescription medicines only as directed by your caregiver. SEEK IMMEDIATE MEDICAL CARE IF:   You have weakness or numbness in one of your legs or feet.  You have trouble controlling your bladder or bowels.  You have nausea, vomiting, abdominal pain, shortness of breath, or fainting. Document Released: 08/22/2004 Document Revised: 10/07/2011 Document Reviewed: 06/29/2011 [facility] Patient Information 2014 [place], [place].  Dental Pain A tooth ache may be caused by cavities (tooth decay). Cavities expose the nerve of the tooth to air and hot or cold temperatures. It may come from an infection or abscess (also called a boil or furuncle) around your tooth. It is also often caused by dental caries (tooth decay). This causes the pain you are having. DIAGNOSIS  Your caregiver can diagnose this problem by exam. TREATMENT   If caused by an infection, it may be treated with medications which kill germs (antibiotics) and pain medications as prescribed by your caregiver. Take medications as directed.  Only take  over-the-counter or prescription medicines for pain, discomfort, or fever as directed by your caregiver.  Whether the tooth ache today is caused by infection or dental disease, you should see your dentist as soon as possible for further care. SEEK MEDICAL CARE IF: The exam and treatment you received today has been provided on an emergency basis only. This is not a substitute for complete medical or dental care. If your problem worsens or new problems (symptoms) appear, and you are unable to meet with your dentist, call or return to this location. SEEK IMMEDIATE MEDICAL CARE IF:   You have a fever.  You develop redness and swelling of your face, jaw, or neck.  You are unable to open your mouth.  You have severe pain uncontrolled by pain medicine. MAKE SURE YOU:   Understand these instructions.  Will watch your condition.  Will get help right away if you are not doing well or get worse. Document Released: 07/15/2005 Document Revised: 10/07/2011 Document Reviewed: 03/02/2008 [facility] Patient Information 2014 [place], [place].

## 2013-11-13 NOTE — ED Provider Notes (Signed)
CSN: [id number]     Arrival date & time 11/13/13  0223 History   First MD Initiated Contact with Patient 11/13/13 0255     Chief Complaint  Patient presents with  . Jaw Pain     (Consider location/radiation/quality/duration/timing/severity/associated sxs/prior Treatment) HPI Patient had multiple teeth extracted on 4/6. He's had ongoing pain bedside extraction since. He's had no fever or chills. He denies any intraoral swelling. Patient is yet to followup with his dentist. Is currently taking amoxicillin. Patient states he's here tonight also for his chronic low back pain the past 2 years. He states the pain does not radiate. He has no bladder or bowel incontinence. He has no weakness or numbness. He's been taking tramadol without significant relief. He is taking no medications evening. He denies any urinary symptoms. Past Medical History  Diagnosis Date  . Hypertension    Past Surgical History  Procedure Laterality Date  . Mouth surgery     No family history on file. History  Substance Use Topics  . Smoking status: Current Every Day Smoker -- 1.00 packs/day    Types: Cigarettes  . Smokeless tobacco: Never Used  . Alcohol Use: No    Review of Systems  Constitutional: Negative for fever and chills.  HENT: Positive for dental problem. Negative for sore throat and voice change.   Respiratory: Negative for shortness of breath.   Cardiovascular: Negative for chest pain, palpitations and leg swelling.  Gastrointestinal: Negative for nausea, vomiting and abdominal pain.  Genitourinary: Negative for dysuria, frequency and difficulty urinating.  Musculoskeletal: Positive for back pain and myalgias. Negative for neck pain and neck stiffness.  Skin: Negative for rash and wound.  Neurological: Positive for headaches. Negative for dizziness, weakness, light-headedness and numbness.  All other systems reviewed and are negative.     Allergies  Review of patient's allergies indicates no  known allergies.  Home Medications   Prior to Admission medications   Medication Sig Start Date End Date Taking? Authorizing Provider  amoxicillin (AMOXIL) 500 MG tablet Take 500 mg by mouth 3 (three) times daily. For wisdom teeth extraction    Historical Provider, MD  HYDROcodone-acetaminophen (NORCO) 10-325 MG per tablet Take 1 tablet by mouth every 4 (four) hours as needed for moderate pain or severe pain (dental extractions).    Historical Provider, MD  metoCLOPramide (REGLAN) 10 MG tablet Take 1 tablet (10 mg total) by mouth every 6 (six) hours as needed for nausea (or headache). 11/03/13   Dione Boozeavid Glick, MD  naproxen (NAPROSYN) 500 MG tablet Take 1 tablet (500 mg total) by mouth 2 (two) times daily. 11/03/13   Dione Boozeavid Glick, MD   BP 151/88  Pulse 94  Temp(Src) 98.1 F (36.7 C) (Oral)  Resp 16  Ht 5\' 7"  (1.702 m)  Wt 180 lb (81.647 kg)  BMI 28.19 kg/m2  SpO2 98% Physical Exam  Nursing note and vitals reviewed. Constitutional: He is oriented to person, place, and time. He appears well-developed and well-nourished. No distress.  HENT:  Head: Normocephalic and atraumatic.  Mouth/Throat: Oropharynx is clear and moist. No oropharyngeal exudate.  Multiple dental extractions without any evidence of erythema, swelling or masses.  Eyes: EOM are normal. Pupils are equal, round, and reactive to light.  Neck: Normal range of motion. Neck supple.  Cardiovascular: Normal rate and regular rhythm.   Pulmonary/Chest: Effort normal and breath sounds normal. No respiratory distress. He has no wheezes. He has no rales.  Abdominal: Soft. Bowel sounds are normal. He exhibits no  distension and no mass. There is no tenderness. There is no rebound and no guarding.  Musculoskeletal: Normal range of motion. He exhibits tenderness (diffuse lumbar tenderness to palpation especially in the lumbar paraspinal muscles.). He exhibits no edema.  Negative straight leg raise bilaterally.  Lymphadenopathy:    He has no  cervical adenopathy.  Neurological: He is alert and oriented to person, place, and time.  Patient is alert and oriented x3 with clear, goal oriented speech. Patient has 5/5 motor in all extremities. Sensation is intact to light touch. Patient has a normal gait and walks without assistance.   Skin: Skin is warm and dry. No rash noted. No erythema.  Psychiatric: He has a normal mood and affect. His behavior is normal.    ED Course  Procedures (including critical care time) Labs Review Labs Reviewed - No data to display  Imaging Review No results found.   EKG Interpretation None      MDM   Final diagnoses:  None    Patient's back pain appears to be chronic in nature. He has no concerning findings for cauda equina syndrome. He is also status post dental extraction. She no evidence for infection at this point. We'll treat symptomatically and advised continue his amoxicillin followup with his dentist. Return precautions have been given.    Loren Raceravid Sheyann Sulton, MD 11/13/13 [phone]

## 2013-12-18 ENCOUNTER — Emergency Department (HOSPITAL_COMMUNITY)
Admission: EM | Admit: 2013-12-18 | Discharge: 2013-12-18 | Disposition: A | Discharge status: Home / Self Care | Payer: Medicaid Other | Attending: Emergency Medicine | Admitting: Emergency Medicine

## 2013-12-18 ENCOUNTER — Emergency Department (HOSPITAL_COMMUNITY): Payer: Medicaid Other

## 2013-12-18 ENCOUNTER — Encounter (HOSPITAL_COMMUNITY): Payer: Self-pay | Admitting: Emergency Medicine

## 2013-12-18 DIAGNOSIS — F17200 Nicotine dependence, unspecified, uncomplicated: Secondary | ICD-10-CM | POA: Insufficient documentation

## 2013-12-18 DIAGNOSIS — S99919A Unspecified injury of unspecified ankle, initial encounter: Principal | ICD-10-CM

## 2013-12-18 DIAGNOSIS — Y9339 Activity, other involving climbing, rappelling and jumping off: Secondary | ICD-10-CM | POA: Insufficient documentation

## 2013-12-18 DIAGNOSIS — X58XXXA Exposure to other specified factors, initial encounter: Secondary | ICD-10-CM | POA: Insufficient documentation

## 2013-12-18 DIAGNOSIS — M79672 Pain in left foot: Secondary | ICD-10-CM

## 2013-12-18 DIAGNOSIS — S8990XA Unspecified injury of unspecified lower leg, initial encounter: Secondary | ICD-10-CM | POA: Insufficient documentation

## 2013-12-18 DIAGNOSIS — I10 Essential (primary) hypertension: Secondary | ICD-10-CM | POA: Insufficient documentation

## 2013-12-18 DIAGNOSIS — Y929 Unspecified place or not applicable: Secondary | ICD-10-CM | POA: Insufficient documentation

## 2013-12-18 DIAGNOSIS — S99929A Unspecified injury of unspecified foot, initial encounter: Principal | ICD-10-CM

## 2013-12-18 NOTE — ED Notes (Signed)
Ortho at bedside.

## 2013-12-18 NOTE — ED Notes (Signed)
Patient on the phone crying.

## 2013-12-18 NOTE — ED Notes (Addendum)
PT ambulated with baseline gait; VSS; A&Ox3; no signs of distress; respirations even and unlabored; skin warm and dry; no questions upon discharge. Pt continuing to talk on hospital phone during discharge process.

## 2013-12-18 NOTE — ED Notes (Addendum)
Patient back from xray.  Ambulated to the BR without difficulty.  Had security remove male friend from the room.  Patient and friend were fighting out loud.   Her car keys were gotten from the patient and the friend was asked to leave the room.

## 2013-12-18 NOTE — ED Provider Notes (Signed)
CSN: [id number]     Arrival date & time 12/18/13  0541 History   First MD Initiated Contact with Patient 12/18/13 [phone]     Chief Complaint  Patient presents with  . Foot Pain     (Consider location/radiation/quality/duration/timing/severity/associated sxs/prior Treatment) HPI Comments: Patient presents today with a chief complaint of pain of his left heel.  He states that he jumped down some stairs trying to get away from someone and landed on his heel.  He reports that the pain has been constant for the past 3 hours.  Pain worse with bear weight.  He has been able to ambulate, but reports increased pain with ambulation.  He denies hitting his head or LOC.  Denies neck pain, back pain, hip pain, or knee pain.  Denies pain of the right foot.  Denies numbness or tingling.  The history is provided by the patient.    Past Medical History  Diagnosis Date  . Hypertension    Past Surgical History  Procedure Laterality Date  . Mouth surgery     History reviewed. No pertinent family history. History  Substance Use Topics  . Smoking status: Current Every Day Smoker -- 1.00 packs/day    Types: Cigarettes  . Smokeless tobacco: Never Used  . Alcohol Use: Yes     Comment: ocassionally    Review of Systems  Musculoskeletal:       Left foot pain  Neurological: Negative for dizziness and numbness.      Allergies  Review of patient's allergies indicates no known allergies.  Home Medications   Prior to Admission medications   Medication Sig Start Date End Date Taking? Authorizing Provider  ibuprofen (ADVIL,MOTRIN) 200 MG tablet Take 400 mg by mouth every 6 (six) hours as needed for moderate pain.   Yes Historical Provider, MD   BP 135/80  Pulse 104  Temp(Src) 97.9 F (36.6 C) (Oral)  Resp 20  Ht 5\' 8"  (1.727 m)  Wt 178 lb (80.74 kg)  BMI 27.07 kg/m2  SpO2 100% Physical Exam  Nursing note and vitals reviewed. Constitutional: He appears well-developed and well-nourished.   HENT:  Head: Normocephalic and atraumatic.  Mouth/Throat: Oropharynx is clear and moist.  Cardiovascular: Normal rate, regular rhythm and normal heart sounds.   Pulses:      Dorsalis pedis pulses are 2+ on the right side, and 2+ on the left side.  Pulmonary/Chest: Effort normal and breath sounds normal.  Musculoskeletal:       Right hip: Normal. He exhibits normal range of motion and no tenderness.       Left hip: He exhibits normal range of motion and no tenderness.       Right knee: Normal. He exhibits normal range of motion. No tenderness found.       Left knee: He exhibits normal range of motion. No tenderness found.       Right ankle: Normal. He exhibits normal range of motion. No tenderness.       Left ankle: Normal. He exhibits normal range of motion and no swelling. No tenderness.       Cervical back: He exhibits normal range of motion and no tenderness.       Thoracic back: He exhibits normal range of motion and no tenderness.       Lumbar back: He exhibits normal range of motion, no tenderness and no bony tenderness.  Tenderness to palpation and mild swelling of the left heel.  Neurological: He is alert.  Distal sensation  of left toes intact  Skin: Skin is warm and dry.  Psychiatric: He has a normal mood and affect.    ED Course  Procedures (including critical care time) Labs Review Labs Reviewed - No data to display  Imaging Review Dg Foot Complete Left  12/18/2013   CLINICAL DATA:  Jumped down flight of stairs, landing on left foot. Left heel pain and swelling.  EXAM: LEFT FOOT - COMPLETE 3+ VIEW  COMPARISON:  None.  FINDINGS: There is no evidence of fracture or dislocation. The joint spaces are preserved. There is no evidence of talar subluxation; the subtalar joint is unremarkable in appearance.  No significant soft tissue abnormalities are seen.  IMPRESSION: No evidence of fracture or dislocation.   Electronically Signed   By: Roanna RaiderJeffery  Chang M.D.   On: 12/18/2013 06:37      EKG Interpretation None      MDM   Final diagnoses:  None   Patient presenting with left foot pain after jumping down some stairs.  Xray negative.  Patient neurovascularly intact.  Patient given crutches and post op shoe for comfort.  Patient stable for discharge.    Santiago GladHeather Anistyn Graddy, PA-C 12/18/13 [phone]

## 2013-12-18 NOTE — ED Notes (Signed)
Ortho paged. 

## 2013-12-18 NOTE — ED Notes (Signed)
Patient presents stating that he jumped down about 12 steps or more and landed on his left heel.  Heel is swollen and painful

## 2013-12-19 ENCOUNTER — Encounter (HOSPITAL_COMMUNITY): Payer: Self-pay | Admitting: Emergency Medicine

## 2013-12-19 ENCOUNTER — Emergency Department (HOSPITAL_COMMUNITY)
Admission: EM | Admit: 2013-12-19 | Discharge: 2013-12-19 | Disposition: A | Discharge status: Home / Self Care | Payer: Medicaid Other | Attending: Emergency Medicine | Admitting: Emergency Medicine

## 2013-12-19 ENCOUNTER — Emergency Department (HOSPITAL_COMMUNITY): Payer: Medicaid Other

## 2013-12-19 DIAGNOSIS — M25473 Effusion, unspecified ankle: Secondary | ICD-10-CM | POA: Insufficient documentation

## 2013-12-19 DIAGNOSIS — M25579 Pain in unspecified ankle and joints of unspecified foot: Secondary | ICD-10-CM | POA: Insufficient documentation

## 2013-12-19 DIAGNOSIS — G8911 Acute pain due to trauma: Secondary | ICD-10-CM | POA: Insufficient documentation

## 2013-12-19 DIAGNOSIS — F17200 Nicotine dependence, unspecified, uncomplicated: Secondary | ICD-10-CM | POA: Insufficient documentation

## 2013-12-19 DIAGNOSIS — M25476 Effusion, unspecified foot: Secondary | ICD-10-CM | POA: Insufficient documentation

## 2013-12-19 DIAGNOSIS — S9030XA Contusion of unspecified foot, initial encounter: Secondary | ICD-10-CM

## 2013-12-19 DIAGNOSIS — I10 Essential (primary) hypertension: Secondary | ICD-10-CM | POA: Insufficient documentation

## 2013-12-19 MED ORDER — HYDROCODONE-ACETAMINOPHEN 5-325 MG PO TABS
2.0000 | ORAL_TABLET | Freq: Once | ORAL | Status: AC
  Administered 2013-12-19: 2 via ORAL
  Filled 2013-12-19: qty 2

## 2013-12-19 MED ORDER — HYDROCODONE-ACETAMINOPHEN 5-325 MG PO TABS: 2.0000 | ORAL_TABLET | ORAL | Status: DC | PRN

## 2013-12-19 NOTE — ED Provider Notes (Signed)
Medical screening examination/treatment/procedure(s) were performed by non-physician practitioner and as supervising physician I was immediately available for consultation/collaboration.   EKG Interpretation None        Zafir Schauer, MD 12/19/13 0616 

## 2013-12-19 NOTE — ED Provider Notes (Signed)
CSN: [id number]     Arrival date & time 12/19/13  0218 History   First MD Initiated Contact with Patient 12/19/13 0230     Chief Complaint  Patient presents with  . Foot Pain    left foot     (Consider location/radiation/quality/duration/timing/severity/associated sxs/prior Treatment) HPI Comments: Pt is a 28 y/o male with hx of injury to the L foot that occurred yesterday when he hit the posterior heel on the stairs during falling event - denies any other pain but has mild swelling and increased pain  This evening.  Worse with ambulation.  Review of xrays from yesterday showed no foot fractures.  Patient is a 28 y.o. male presenting with lower extremity pain. The history is provided by the patient.  Foot Pain    Past Medical History  Diagnosis Date  . Hypertension    Past Surgical History  Procedure Laterality Date  . Mouth surgery     History reviewed. No pertinent family history. History  Substance Use Topics  . Smoking status: Current Every Day Smoker -- 1.00 packs/day    Types: Cigarettes  . Smokeless tobacco: Never Used  . Alcohol Use: Yes     Comment: ocassionally    Review of Systems  Musculoskeletal: Positive for joint swelling.  Skin: Negative for wound.      Allergies  Review of patient's allergies indicates no known allergies.  Home Medications   Prior to Admission medications   Medication Sig Start Date End Date Taking? Authorizing Provider  ibuprofen (ADVIL,MOTRIN) 200 MG tablet Take 400 mg by mouth every 6 (six) hours as needed for moderate pain.    Historical Provider, MD   BP 126/60  Pulse 81  Temp(Src) 98.5 F (36.9 C)  Resp 18  SpO2 97% Physical Exam  Nursing note and vitals reviewed. Constitutional: He appears well-developed and well-nourished. No distress.  HENT:  Head: Normocephalic and atraumatic.  Eyes: Conjunctivae are normal. No scleral icterus.  Cardiovascular: Normal rate, regular rhythm and intact distal pulses.    Pulmonary/Chest: Effort normal and breath sounds normal.  Musculoskeletal: He exhibits tenderness ( over the L heel posteriorly.  no ttp over the base of the 5th MT on the left.  no ankle ttp.). He exhibits no edema.  Dec ROM 2/2 pain in the L heel posterioly  Neurological: He is alert.  Skin: Skin is warm and dry. No rash noted. He is not diaphoretic.    ED Course  Procedures (including critical care time) Labs Review Labs Reviewed - No data to display  Imaging Review Ct Foot Left Wo Contrast  12/19/2013   CLINICAL DATA:  Injury to left heel when jumping off stairs. Left heel pain and swelling.  EXAM: CT OF THE LEFT FOOT WITHOUT CONTRAST  TECHNIQUE: Multidetector CT imaging was performed according to the standard protocol. Multiplanar CT image reconstructions were also generated.  COMPARISON:  Left foot radiographs performed 12/18/2013  FINDINGS: There is no evidence of fracture or dislocation. The calcaneus appears intact. Visualized joint spaces are preserved. The subtalar joint is unremarkable in appearance. The ankle mortise is within normal limits. The sinus tarsi is unremarkable in appearance; no significant soft tissue edema is seen.  The visualized extensor and flexor tendons are unremarkable in appearance. The peroneus tendons are normal in appearance. No significant ankle joint effusion is identified. The visualized Achilles tendon appears intact. The plantar fascia is grossly unremarkable.  IMPRESSION: No evidence of fracture or dislocation. No significant soft tissue abnormalities seen.  Electronically Signed   By: Roanna RaiderJeffery  Chang M.D.   On: 12/19/2013 04:11   Dg Foot Complete Left  12/18/2013   CLINICAL DATA:  Jumped down flight of stairs, landing on left foot. Left heel pain and swelling.  EXAM: LEFT FOOT - COMPLETE 3+ VIEW  COMPARISON:  None.  FINDINGS: There is no evidence of fracture or dislocation. The joint spaces are preserved. There is no evidence of talar subluxation; the  subtalar joint is unremarkable in appearance.  No significant soft tissue abnormalities are seen.  IMPRESSION: No evidence of fracture or dislocation.   Electronically Signed   By: Roanna RaiderJeffery  Chang M.D.   On: 12/18/2013 06:37      MDM   Final diagnoses:  Contusion of heel    NV intact - increased pain and swelling - xrays reviewed - lateral view looked normal and without frx of calcaneous.  Pt is insistent on imaging - CT scan would be next test to r/o foot frx.  Already has walking shoe and crutches.  Pain meds ordered.  CT neg for frx or dislocation - pt stable for d/c.  Meds given in ED:  Medications  HYDROcodone-acetaminophen (NORCO/VICODIN) 5-325 MG per tablet 2 tablet (2 tablets Oral Given 12/19/13 0255)    New Prescriptions   HYDROCODONE-ACETAMINOPHEN (NORCO/VICODIN) 5-325 MG PER TABLET    Take 2 tablets by mouth every 4 (four) hours as needed.      Vida RollerBrian D Lorrie Strauch, MD 12/19/13 [phone]

## 2013-12-19 NOTE — Discharge Instructions (Signed)
°Emergency Department Resource Guide °1) Find a Doctor and Pay Out of Pocket °Although you won't have to find out who is covered by your insurance plan, it is a good idea to ask around and get recommendations. You will then need to call the office and see if the doctor you have chosen will accept you as a new patient and what types of options they offer for patients who are self-pay. Some doctors offer discounts or will set up payment plans for their patients who do not have insurance, but you will need to ask so you aren't surprised when you get to your appointment. ° °2) Contact Your Local Health Department °Not all health departments have doctors that can see patients for sick visits, but many do, so it is worth a call to see if yours does. If you don't know where your local health department is, you can check in your phone book. The CDC also has a tool to help you locate your state's health department, and many state websites also have listings of all of their local health departments. ° °3) Find a Walk-in Clinic °If your illness is not likely to be very severe or complicated, you may want to try a walk in clinic. These are popping up all over the country in pharmacies, drugstores, and shopping centers. They're usually staffed by nurse practitioners or physician assistants that have been trained to treat common illnesses and complaints. They're usually fairly quick and inexpensive. However, if you have serious medical issues or chronic medical problems, these are probably not your best option. ° °No Primary Care Doctor: °- Call [facility] at  832-8000 - they can help you locate a primary care doctor that  accepts your insurance, provides certain services, etc. °- Physician Referral Service- 1-800-533-3463 ° °Chronic Pain Problems: °Organization         Address  Phone   Notes  °[facility]  (336) 297-2271 Patients need to be referred by their primary care doctor.  ° °Medication  Assistance: °Organization         Address  Phone   Notes  °[place] [facility] [address] °[place], [place] [address] (336) 641-8030 --Must be a resident of [place] °-- Must have NO insurance coverage whatsoever (no Medicaid/ Medicare, etc.) °-- The pt. MUST have a primary care doctor that directs their care regularly and follows them in the community °  °[facility]  (866) 331-1348   °[employer]  (888) 892-1162   ° °Agencies that provide inexpensive medical care: °Organization         Address  Phone   Notes  °[facility]  (336) 832-8035   °[facility]    (336) 832-7272   °[facility] [address] °[place], [place] [address] (336) 832-4777   °[facility] [address], °[place] (336) 271-4999   °[facility]    (336) 373-0678   °[facility]    (336) 272-1050   °[facility] ° [address], [place] Phone:  (336) 832-4444, Fax:  (336) 832-4440 Hours of Operation:  9 am - 6 pm, M-F.  Also accepts Medicaid/Medicare and self-pay.  °[facility] ° [address], [place] Phone: (336) 832-3150, Fax: (336) [phone]. Hours of Operation:  8:30 am - 5:30 pm, M-F.  Also accepts Medicaid and self-pay.  °[facility] [place] [address], [place] Phone: (336) 878-6027   °[facility] [address], [place], [place] (336)723-1848, Ext. 123 Mondays & Thursdays: 7-9 AM.  First 15 patients are seen on a first come, first serve basis. °  ° °Medicaid-accepting [place] Providers: ° °Organization         Address  Phone   Notes  °[facility] [address], [place] (336) 641-2100 Also accepts self-pay patients.  °[facility] [address], [place] ° (336) 856-9996   °[facility] [address], [place]  (336) 288-8857   °[facility] [address] [place] [address], [place] (336) 299-7000   °Veita Bland [address], [place]  ° (336) 373-1557 Only accepts Early Access Medicaid patients after they have their name applied to their card.  ° °Self-Pay (no insurance) in [place]: ° °Organization         Address  Phone   Notes  °Sickle Cell Patients, [facility] [address], [place] (336) 832-1970   °[facility] [address], [place] (336) 832-4400   °[facility] ° [address],  (336) 992-4800   °[facility]/Dr. Osei-Bonsu ° [address] [place] [address], [place] or [address], [place] (336) [phone] Phone number for both [place] and [place] locations is the same.  °[facility] [address], [place] (336) 299-0000   °[facility] [address] [place] [address], [place] or [address] (336) 852-7530 °(336) 878-2260   °[facility] [address], [place] (336) 350-1642, phone; (336) 294-5005, fax Sees patients 1st and 3rd Saturday of every month.  Must not qualify for public or private insurance (i.e. Medicaid, Medicare, [place] Health Choice, Veterans' Benefits) • Household income should be no more than 200% of the poverty level •The clinic cannot treat you if you are pregnant or think you are pregnant • Sexually transmitted diseases are not treated at the clinic.  ° ° °Dental Care: °Organization         Address  Phone  Notes  °[place] [facility] [address], [place] (336) 641-6152 Accepts children up to age 21 who are enrolled in Medicaid or [place] Health Choice; pregnant women with a Medicaid card; and children who have applied for Medicaid or [place] Health Choice, but were declined, whose parents can pay a reduced fee at time of service.  °[place]  [facility] [place]  [address], [place] (336) 641-7733 Accepts children up to age 21 who are enrolled in Medicaid or [place] Health Choice; pregnant women with a Medicaid card; and children who have applied for Medicaid or [place] Health Choice, but were declined, whose parents can pay a reduced fee at time of service.  °[facility] ° [address],  (336) 641-4533 Patients are seen by appointment only. Walk-ins are not accepted. [facility] will see patients 18 years of age and older. °Monday - Tuesday (8am-5pm) °Most Wednesdays (8:30-5pm) °$30 per visit, cash only  °[facility] ° [address], [place] (336) 641-4533 Patients are seen by appointment only. Walk-ins are not accepted. [facility] will see patients 18 years of age and older. °One   Wednesday Evening (Monthly: Volunteer Based).  $30 per visit, cash only  °[facility]  (919) 537-3737 for adults; Children under age 4, call Graduate Pediatric Dentistry at (919) [phone]. Children aged 4-14, please call (919) [phone] to request a pediatric application. ° Dental services are provided in all areas of dental care including fillings, crowns and bridges, complete and partial dentures, implants, gum treatment, root canals, and extractions. Preventive care is also provided. Treatment is provided to both adults and children. °Patients are selected via a lottery and there is often a waiting list. °  °[facility] [address], °[place] ° (336) 763-8833 [email or web address] °  °[facility] [address], [place], [place] (336)723-1848, Ext. 123 Second and Fourth Thursday of each month, opens at 6:30 AM; Clinic ends at 9 AM.  Patients are seen on a first-come first-served basis, and a limited number are seen during each clinic.  ° °[facility] ° [address], [place], [place] (336) 723-7904    Eligibility Requirements °You must have lived in [place], [place], or [place] counties for at least the last three months. °  You cannot be eligible for state or federal sponsored healthcare insurance, including Veterans Administration, Medicaid, or Medicare. °  You generally cannot be eligible for healthcare insurance through your employer.  °  How to apply: °Eligibility screenings are held every Tuesday and Wednesday afternoon from 1:00 pm until 4:00 pm. You do not need an appointment for the interview!  °[facility] [address], [place], [place] 336-631-2330   °[facility]  336-342-8273   °[facility]  336-703-3100   °[facility]  336-570-6415   ° °Behavioral Health Resources in the Community: °Intensive Outpatient Programs °Organization         Address  Phone  Notes  °[facility] [address], [place], [place] 336-878-6098   °[facility] [address], [place], [place] 336-832-9800   °[facility] [address], [place], [place] ° 336-882-2125   °[facility] [address],  °[place], [place] 1-800-853-5163 or 336-641-4981   °Substance Abuse Resources °Organization         Address  Phone  Notes  °[facility]  336-882-2125   °[facility]  336-784-9470   °[facility]  336-285-9073   °[facility]  336-845-3988   °Residential & Outpatient Substance Abuse Program  1-800-659-3381   °Psychological Services °Organization         Address  Phone  Notes  °[facility]  336- 832-9600   °[facility]  336- 378-7881   °[facility] [address], [place] 1-800-853-5163 or 336-641-4981   ° °Mobile Crisis Teams °Organization         Address  Phone  Notes  °[facility], Mobile Crisis Care Unit  1-877-626-1772   °Assertive °[facility] ° [address]  [place], [place] 336-834-9664   °Sharon DeEsch [address] °[place] 336-554-5454   ° °Self-Help/Support Groups °Organization         Address  Phone             Notes  °[facility] - variety of support groups  336- 373-1402 Call for more information  °Narcotics Anonymous (NA), [facility] [address], °[place]  2 meetings at this location  ° °  Residential Treatment Programs °Organization         Address  Phone  Notes  °[facility] [address],    °[place]  1-866-801-8205   °[facility] ° [address], [place], [place] 704-293-8524   °[facility] [address], [place] 336-845-3988 Admissions: 8am-3pm M-F  °[facility] [address]    °[place], [place] 336-841-1104   °[facility] [address], [place], [place] 336-379-7146   °[facility] [address]  °[place], [place] 336-285-9073   °[facility] - Intensive Outpatient [address], [place], [place] 336-852-3033   °[facility] [address]  °[place], [place] 1-877-615-2722 or 336-784-9470   °[facility] [address] [place], [place] 336-227-7417 Accepts Medicaid  °[facility] [address]  °[place] 1-800-659-3381 Substance Abuse/Addiction Treatment  ° °[place] Behavioral Health Resources °Organization         Address  Phone  Notes  °[facility]  (888) 581-9988   °Julie Brannon, PhD [address] [place], [place]   (336) 349-5553 or (336) 951-0000   °[facility]   [address] °[place], [place] (336) 349-4454   °[facility] [address], [place], [place] (336) 342-8316 Insurance/Medicaid/sponsorship through [facility]  °[facility] [address]                                    [place], [place] (336) 342-8316 Therapy/tele-psych/case    °[facility] [address]  ° [place], [place] (336) 349-2233    °Dr. Arfeen  (336) 349-4544   °[facility] [place]  [employer] [place] [facility] 1) [address], [place] °2) [address], [place] °3)  [address], [place] (336) 349-3220 °(336) 342-7768 ° °(336) 342-8140   °[place] [facility] (336) 342-1394 or (336) 342-3537 (After Hours)    ° ° °

## 2013-12-19 NOTE — ED Notes (Signed)
MD at bedside. 

## 2013-12-19 NOTE — ED Notes (Signed)
Patient is alert and orineted x3.  He is complaining of left foot pain that started yesterday after he fell going Down stairs.  He was seen at [facility] yesterday and was DC to home.  This morning he has been unable  To get any comfortable.  He adds that he feels that his ankle is starting to swell.  Currently he rates his pain 10 of 10.

## 2014-01-12 ENCOUNTER — Emergency Department (HOSPITAL_COMMUNITY)
Admission: EM | Admit: 2014-01-12 | Discharge: 2014-01-12 | Disposition: A | Discharge status: Home / Self Care | Payer: Medicaid Other | Attending: Emergency Medicine | Admitting: Emergency Medicine

## 2014-01-12 ENCOUNTER — Encounter (HOSPITAL_COMMUNITY): Payer: Self-pay | Admitting: Emergency Medicine

## 2014-01-12 DIAGNOSIS — F17200 Nicotine dependence, unspecified, uncomplicated: Secondary | ICD-10-CM | POA: Insufficient documentation

## 2014-01-12 DIAGNOSIS — M62830 Muscle spasm of back: Secondary | ICD-10-CM

## 2014-01-12 DIAGNOSIS — I10 Essential (primary) hypertension: Secondary | ICD-10-CM | POA: Insufficient documentation

## 2014-01-12 DIAGNOSIS — M5380 Other specified dorsopathies, site unspecified: Secondary | ICD-10-CM | POA: Insufficient documentation

## 2014-01-12 MED ORDER — METHOCARBAMOL 500 MG PO TABS
1000.0000 mg | ORAL_TABLET | Freq: Once | ORAL | Status: AC
  Administered 2014-01-12: 1000 mg via ORAL
  Filled 2014-01-12: qty 2

## 2014-01-12 MED ORDER — TRAMADOL HCL 50 MG PO TABS
50.0000 mg | ORAL_TABLET | Freq: Once | ORAL | Status: DC
  Filled 2014-01-12: qty 1

## 2014-01-12 MED ORDER — METHOCARBAMOL 500 MG PO TABS: 500.0000 mg | ORAL_TABLET | Freq: Two times a day (BID) | ORAL | Status: DC

## 2014-01-12 MED ORDER — OXYCODONE-ACETAMINOPHEN 5-325 MG PO TABS
1.0000 | ORAL_TABLET | Freq: Once | ORAL | Status: AC
  Administered 2014-01-12: 1 via ORAL
  Filled 2014-01-12: qty 1

## 2014-01-12 MED ORDER — MELOXICAM 7.5 MG PO TABS: 7.5000 mg | ORAL_TABLET | Freq: Every day | ORAL | Status: DC

## 2014-01-12 NOTE — ED Notes (Addendum)
Pt discharged home with all belongings, pt alert, oriented and ambulatory upon discharge, [facility], 2 new rx prescribed, pt verbalizes understanding of discharge instructions. Pt driven home by family at bedside

## 2014-01-12 NOTE — ED Notes (Signed)
Pt reports mid back pain starting 3 days ago, pt states he was bending over to lift something and upon standing he heard something pop. Pt denies any issues with bowel or bladder

## 2014-01-12 NOTE — Discharge Instructions (Signed)

## 2014-01-12 NOTE — ED Notes (Signed)
Patient presents with c/o back pain that started after lifting something.  Said he felt like something popped

## 2014-01-12 NOTE — ED Notes (Signed)
Patient transported to X-ray 

## 2014-01-12 NOTE — ED Notes (Signed)
EDP [place] aware of pt refusing Tramadol

## 2014-01-12 NOTE — ED Provider Notes (Signed)
CSN: [id number]     Arrival date & time 01/12/14  0446 History   First MD Initiated Contact with Patient 01/12/14 [phone]     Chief Complaint  Patient presents with  . Back Pain     (Consider location/radiation/quality/duration/timing/severity/associated sxs/prior Treatment) Patient is a 28 y.o. male presenting with back pain. The history is provided by the patient. No language interpreter was used.  Back Pain Location:  Sacro-iliac joint Quality:  Aching Radiates to:  Does not radiate Pain severity:  Moderate Pain is:  Same all the time Onset quality:  Sudden Timing:  Constant Progression:  Unchanged Chronicity:  New Context: lifting heavy objects   Relieved by:  Nothing Worsened by:  Nothing tried Ineffective treatments:  None tried Associated symptoms: no abdominal pain, no abdominal swelling, no bladder incontinence, no bowel incontinence, no chest pain, no dysuria, no fever, no headaches, no leg pain, no numbness, no paresthesias, no pelvic pain, no perianal numbness, no tingling, no weakness and no weight loss   Risk factors: no hx of cancer     Past Medical History  Diagnosis Date  . Hypertension    Past Surgical History  Procedure Laterality Date  . Mouth surgery     History reviewed. No pertinent family history. History  Substance Use Topics  . Smoking status: Current Every Day Smoker -- 1.00 packs/day    Types: Cigarettes  . Smokeless tobacco: Never Used  . Alcohol Use: Yes     Comment: ocassionally    Review of Systems  Constitutional: Negative for fever and weight loss.  Cardiovascular: Negative for chest pain.  Gastrointestinal: Negative for abdominal pain and bowel incontinence.  Genitourinary: Negative for bladder incontinence, dysuria and pelvic pain.  Musculoskeletal: Positive for back pain.  Neurological: Negative for tingling, weakness, numbness, headaches and paresthesias.  All other systems reviewed and are negative.     Allergies  Review  of patient's allergies indicates no known allergies.  Home Medications   Prior to Admission medications   Medication Sig Start Date End Date Taking? Authorizing Provider  ibuprofen (ADVIL,MOTRIN) 200 MG tablet Take 400 mg by mouth every 6 (six) hours as needed for moderate pain.   Yes Historical Provider, MD  naproxen sodium (ANAPROX) 220 MG tablet Take 220 mg by mouth daily as needed (pain).   Yes Historical Provider, MD   BP 139/78  Pulse 76  Temp(Src) 98.1 F (36.7 C) (Oral)  Resp 18  Ht 5\' 8"  (1.727 m)  Wt 181 lb (82.101 kg)  BMI 27.53 kg/m2  SpO2 99% Physical Exam  Constitutional: He is oriented to person, place, and time. He appears well-developed and well-nourished. No distress.  HENT:  Head: Normocephalic and atraumatic.  Mouth/Throat: Oropharynx is clear and moist.  Eyes: Conjunctivae are normal. Pupils are equal, round, and reactive to light.  Neck: Normal range of motion. Neck supple.  Cardiovascular: Normal rate, regular rhythm and intact distal pulses.   Pulmonary/Chest: Effort normal and breath sounds normal. He has no wheezes. He has no rales.  Abdominal: Soft. Bowel sounds are normal. There is no tenderness. There is no rebound and no guarding.  Musculoskeletal: Normal range of motion. He exhibits no edema and no tenderness.  No c t or l spine tenderness.   Palpable spasm in the lumbar paraspinal muscles.    Neurological: He is alert and oriented to person, place, and time. He has normal reflexes.  Gait normal  Skin: Skin is warm and dry.  Psychiatric: He has a normal  mood and affect.    ED Course  Procedures (including critical care time) Labs Review Labs Reviewed - No data to display  Imaging Review No results found.   EKG Interpretation None      MDM   Final diagnoses:  None   Muscle spasm Muscle relaxants and pain medication No indication for xrays at this time    April Smitty CordsK Palumbo-Rasch, MD 01/12/14 [phone]

## 2014-02-14 ENCOUNTER — Encounter (HOSPITAL_COMMUNITY): Payer: Self-pay | Admitting: Emergency Medicine

## 2014-02-14 ENCOUNTER — Emergency Department (HOSPITAL_COMMUNITY): Payer: Medicaid Other

## 2014-02-14 ENCOUNTER — Emergency Department (HOSPITAL_COMMUNITY)
Admission: EM | Admit: 2014-02-14 | Discharge: 2014-02-14 | Disposition: A | Discharge status: Home / Self Care | Payer: Medicaid Other | Attending: Emergency Medicine | Admitting: Emergency Medicine

## 2014-02-14 DIAGNOSIS — S6990XA Unspecified injury of unspecified wrist, hand and finger(s), initial encounter: Secondary | ICD-10-CM | POA: Diagnosis present

## 2014-02-14 DIAGNOSIS — S60229A Contusion of unspecified hand, initial encounter: Secondary | ICD-10-CM | POA: Diagnosis not present

## 2014-02-14 DIAGNOSIS — Y9289 Other specified places as the place of occurrence of the external cause: Secondary | ICD-10-CM | POA: Diagnosis not present

## 2014-02-14 DIAGNOSIS — S60221A Contusion of right hand, initial encounter: Secondary | ICD-10-CM

## 2014-02-14 DIAGNOSIS — S6980XA Other specified injuries of unspecified wrist, hand and finger(s), initial encounter: Secondary | ICD-10-CM | POA: Diagnosis not present

## 2014-02-14 DIAGNOSIS — I10 Essential (primary) hypertension: Secondary | ICD-10-CM | POA: Insufficient documentation

## 2014-02-14 DIAGNOSIS — IMO0002 Reserved for concepts with insufficient information to code with codable children: Secondary | ICD-10-CM | POA: Diagnosis not present

## 2014-02-14 DIAGNOSIS — Z79899 Other long term (current) drug therapy: Secondary | ICD-10-CM | POA: Diagnosis not present

## 2014-02-14 DIAGNOSIS — F17200 Nicotine dependence, unspecified, uncomplicated: Secondary | ICD-10-CM | POA: Insufficient documentation

## 2014-02-14 DIAGNOSIS — Y9361 Activity, american tackle football: Secondary | ICD-10-CM | POA: Diagnosis not present

## 2014-02-14 DIAGNOSIS — Z791 Long term (current) use of non-steroidal anti-inflammatories (NSAID): Secondary | ICD-10-CM | POA: Insufficient documentation

## 2014-02-14 MED ORDER — IBUPROFEN 800 MG PO TABS
800.0000 mg | ORAL_TABLET | Freq: Once | ORAL | Status: AC
  Administered 2014-02-14: 800 mg via ORAL
  Filled 2014-02-14: qty 1

## 2014-02-14 MED ORDER — IBUPROFEN 600 MG PO TABS: 600.0000 mg | ORAL_TABLET | Freq: Four times a day (QID) | ORAL | Status: DC | PRN

## 2014-02-14 MED ORDER — TRAMADOL HCL 50 MG PO TABS
50.0000 mg | ORAL_TABLET | Freq: Once | ORAL | Status: AC
  Administered 2014-02-14: 50 mg via ORAL
  Filled 2014-02-14: qty 1

## 2014-02-14 NOTE — Progress Notes (Signed)
Orthopedic Tech Progress Note Patient Details:  Thomas Harrell 12/29/1985 [id number]  Ortho Devices Type of Ortho Device: Ulna gutter splint Ortho Device/Splint Location: ulna gutter splint applint to the right upper extremity. splint wear and care is gone over verbally with patient. he will contact nursing staff with any further questions.   Early CharsBaker,Kathrynne Kulinski Anthony 02/14/2014, 4:49 AM

## 2014-02-14 NOTE — ED Provider Notes (Signed)
CSN: [id number]     Arrival date & time 02/14/14  0244 History   First MD Initiated Contact with Patient 02/14/14 0309     Chief Complaint  Patient presents with  . Hand Pain     (Consider location/radiation/quality/duration/timing/severity/associated sxs/prior Treatment) HPI This patient is a generally healthy young man who presents with complaints of pain over the lateral aspect of the right fifth metacarpal. He accidentally struck his right hand, over this region on the door of a car shortly prior to arrival. He fell lingering pain. So, he felt it best to be evaluated in the emergency department. He notes that he injured his hand, also via blunt trauma, 2 weeks ago. However, he was not seen after that injury.  The patient describes pain as aching, nonradiating, worse with palpation. He denies paresthesias and motor weakness. Past Medical History  Diagnosis Date  . Hypertension    Past Surgical History  Procedure Laterality Date  . Mouth surgery     No family history on file. History  Substance Use Topics  . Smoking status: Current Every Day Smoker -- 1.00 packs/day    Types: Cigarettes  . Smokeless tobacco: Never Used  . Alcohol Use: Yes     Comment: ocassionally    Review of Systems Ten point review of symptoms performed and is negative with the exception of symptoms noted above.    Allergies  Review of patient's allergies indicates no known allergies.  Home Medications   Prior to Admission medications   Medication Sig Start Date End Date Taking? Authorizing Provider  ibuprofen (ADVIL,MOTRIN) 200 MG tablet Take 400 mg by mouth every 6 (six) hours as needed for moderate pain.    Historical Provider, MD  meloxicam (MOBIC) 7.5 MG tablet Take 1 tablet (7.5 mg total) by mouth daily. 01/12/14   April K Palumbo-Rasch, MD  methocarbamol (ROBAXIN) 500 MG tablet Take 1 tablet (500 mg total) by mouth 2 (two) times daily. 01/12/14   April K Palumbo-Rasch, MD  naproxen sodium  (ANAPROX) 220 MG tablet Take 220 mg by mouth daily as needed (pain).    Historical Provider, MD   BP 123/74  Pulse 86  Temp(Src) 98.5 F (36.9 C) (Oral)  Resp 16  SpO2 98% Physical Exam Gen: well developed and well nourished appearing Head: NCAT Eyes: PERL, EOMI Nose: no epistaixis or rhinorrhea Mouth/throat: mucosa is moist and pink Neck: normal to inspection Lungs: CTA B, no wheezing, rhonchi or rales CV: RRR, no murmur, extremities appear well perfused.  Abd: soft, normal to inspection Back: normal to inspection Skin: warm and dry Ext: normal to inspection, mild ttp over the right 5th metacarpal - no deformities, distal NVI, FROM all joints of right finger and hand, cap refill < 2s at nailbeds Neuro: CN ii-xii grossly intact, no focal deficits Psyche; normal affect,  calm and cooperative.   ED Course  Procedures (including critical care time) Labs Review  Imaging Review Dg Hand Complete Right  02/14/2014   CLINICAL DATA:  Blunt trauma to the right hand, with pain about the fifth metacarpal.  EXAM: RIGHT HAND - COMPLETE 3+ VIEW  COMPARISON:  Right thumb radiographs performed 04/04/2009  FINDINGS: There is no evidence of fracture or dislocation. The joint spaces are preserved; mild soft tissue swelling is noted about the fifth metacarpal. The carpal rows are intact, and demonstrate normal alignment.  IMPRESSION: No evidence of fracture or dislocation.   Electronically Signed   By: Roanna Raider M.D.   On: 02/14/2014  03:22      MDM   Ddx: contusion v. Non-displaced metacarpal fx.    Final diagnoses:  Contusion of right hand, initial encounter        Brandt LoosenJulie Marcayla Budge, MD 02/14/14 [phone]

## 2014-02-14 NOTE — ED Notes (Signed)
The pt has had rt hand pain for 2 weeks.  He struck it on an object and it keeps huting.  Pain  Along the rt 5th metacarpal

## 2014-02-14 NOTE — ED Notes (Signed)
Ortho paged for ulnar gutter splint 

## 2014-02-14 NOTE — Discharge Instructions (Signed)
Hand Contusion °A hand contusion is a deep bruise on your hand area. Contusions are the result of an injury that caused bleeding under the skin. The contusion may turn blue, purple, or yellow. Minor injuries will give you a painless contusion, but more severe contusions may stay painful and swollen for a few weeks. °CAUSES  °A contusion is usually caused by a blow, trauma, or direct force to an area of the body. °SYMPTOMS  °· Swelling and redness of the injured area. °· Discoloration of the injured area. °· Tenderness and soreness of the injured area. °· Pain. °DIAGNOSIS  °The diagnosis can be made by taking a history and performing a physical exam. An X-ray, CT scan, or MRI may be needed to determine if there were any associated injuries, such as broken bones (fractures). °TREATMENT  °Often, the best treatment for a hand contusion is resting, elevating, icing, and applying cold compresses to the injured area. Over-the-counter medicines may also be recommended for pain control. °HOME CARE INSTRUCTIONS  °· Put ice on the injured area. °¨ Put ice in a plastic bag. °¨ Place a towel between your skin and the bag. °¨ Leave the ice on for 15-20 minutes, 03-04 times a day. °· Only take over-the-counter or prescription medicines as directed by your caregiver. Your caregiver may recommend avoiding anti-inflammatory medicines (aspirin, ibuprofen, and naproxen) for 48 hours because these medicines may increase bruising. °· If told, use an elastic wrap as directed. This can help reduce swelling. You may remove the wrap for sleeping, showering, and bathing. If your fingers become numb, cold, or blue, take the wrap off and reapply it more loosely. °· Elevate your hand with pillows to reduce swelling. °· Avoid overusing your hand if it is painful. °SEEK IMMEDIATE MEDICAL CARE IF:  °· You have increased redness, swelling, or pain in your hand. °· Your swelling or pain is not relieved with medicines. °· You have loss of feeling in  your hand or are unable to move your fingers. °· Your hand turns cold or blue. °· You have pain when you move your fingers. °· Your hand becomes warm to the touch. °· Your contusion does not improve in 2 days. °MAKE SURE YOU:  °· Understand these instructions. °· Will watch your condition. °· Will get help right away if you are not doing well or get worse. °Document Released: 01/04/2002 Document Revised: 04/08/2012 Document Reviewed: 01/06/2012 °ExitCare® Patient Information ©2015 [employer]. This information is not intended to replace advice given to you by your health care provider. Make sure you discuss any questions you have with your health care provider. ° °

## 2014-02-14 NOTE — ED Notes (Signed)
MD Manly at bedside. 

## 2014-02-14 NOTE — ED Notes (Signed)
Pt presents with Right hand pain x2 weeks, pt states he was playing football with his nephew 2 weeks ago and thought he heard a "cracking" noise at the time of the injury. Pt states the pain was tolerable for the last 2 weeks until he accidentally hit it against something last night and he has had constant pain since.

## 2014-02-14 NOTE — ED Notes (Signed)
Ortho at bedside.

## 2014-03-03 ENCOUNTER — Encounter (HOSPITAL_COMMUNITY): Payer: Self-pay | Admitting: Emergency Medicine

## 2014-03-03 ENCOUNTER — Emergency Department (HOSPITAL_COMMUNITY)
Admission: EM | Admit: 2014-03-03 | Discharge: 2014-03-03 | Disposition: A | Discharge status: Home / Self Care | Payer: Medicaid Other | Attending: Emergency Medicine | Admitting: Emergency Medicine

## 2014-03-03 DIAGNOSIS — Y9289 Other specified places as the place of occurrence of the external cause: Secondary | ICD-10-CM | POA: Insufficient documentation

## 2014-03-03 DIAGNOSIS — H209 Unspecified iridocyclitis: Secondary | ICD-10-CM | POA: Insufficient documentation

## 2014-03-03 DIAGNOSIS — S0990XA Unspecified injury of head, initial encounter: Secondary | ICD-10-CM | POA: Diagnosis not present

## 2014-03-03 DIAGNOSIS — W208XXA Other cause of strike by thrown, projected or falling object, initial encounter: Secondary | ICD-10-CM | POA: Insufficient documentation

## 2014-03-03 DIAGNOSIS — S0510XA Contusion of eyeball and orbital tissues, unspecified eye, initial encounter: Secondary | ICD-10-CM | POA: Insufficient documentation

## 2014-03-03 DIAGNOSIS — H1131 Conjunctival hemorrhage, right eye: Secondary | ICD-10-CM

## 2014-03-03 DIAGNOSIS — I10 Essential (primary) hypertension: Secondary | ICD-10-CM | POA: Insufficient documentation

## 2014-03-03 DIAGNOSIS — F17200 Nicotine dependence, unspecified, uncomplicated: Secondary | ICD-10-CM | POA: Diagnosis not present

## 2014-03-03 DIAGNOSIS — H1130 Conjunctival hemorrhage, unspecified eye: Secondary | ICD-10-CM | POA: Insufficient documentation

## 2014-03-03 DIAGNOSIS — Y990 Civilian activity done for income or pay: Secondary | ICD-10-CM | POA: Insufficient documentation

## 2014-03-03 MED ORDER — ONDANSETRON HCL 4 MG PO TABS: 4.0000 mg | ORAL_TABLET | Freq: Four times a day (QID) | ORAL | Status: DC

## 2014-03-03 MED ORDER — IBUPROFEN 400 MG PO TABS
800.0000 mg | ORAL_TABLET | Freq: Once | ORAL | Status: AC
  Administered 2014-03-03: 800 mg via ORAL
  Filled 2014-03-03: qty 2

## 2014-03-03 MED ORDER — FLUORESCEIN SODIUM 1 MG OP STRP
1.0000 | ORAL_STRIP | Freq: Once | OPHTHALMIC | Status: DC
  Filled 2014-03-03: qty 1

## 2014-03-03 MED ORDER — HYDROCODONE-ACETAMINOPHEN 5-325 MG PO TABS: 1.0000 | ORAL_TABLET | ORAL | Status: DC | PRN

## 2014-03-03 MED ORDER — TETRACAINE HCL 0.5 % OP SOLN
2.0000 [drp] | Freq: Once | OPHTHALMIC | Status: DC
  Filled 2014-03-03: qty 2

## 2014-03-03 NOTE — Discharge Instructions (Signed)
Subconjunctival Hemorrhage A subconjunctival hemorrhage is a bright red patch covering a portion of the white of the eye. The white part of the eye is called the sclera, and it is covered by a thin membrane called the conjunctiva. This membrane is clear, except for tiny blood vessels that you can see with the naked eye. When your eye is irritated or inflamed and becomes red, it is because the vessels in the conjunctiva are swollen. Sometimes, a blood vessel in the conjunctiva can break and bleed. When this occurs, the blood builds up between the conjunctiva and the sclera, and spreads out to create a red area. The red spot may be very small at first. It may then spread to cover a larger part of the surface of the eye, or even all of the visible white part of the eye. In almost all cases, the blood will go away and the eye will become white again. Before completely dissolving, however, the red area may spread. It may also become brownish-yellow in color before going away. If a lot of blood collects under the conjunctiva, it may look like a bulge on the surface of the eye. This looks scary, but it will also eventually flatten out and go away. Subconjunctival hemorrhages do not cause pain, but if swollen, may cause a feeling of irritation. There is no effect on vision.  CAUSES   The most common cause is mild trauma (rubbing the eye, irritation).  Subconjunctival hemorrhages can happen because of coughing or straining (lifting heavy objects), vomiting, or sneezing.  In some cases, your doctor may want to check your blood pressure. High blood pressure can also cause a subconjunctival hemorrhage.  Severe trauma or blunt injuries.  Diseases that affect blood clotting (hemophilia, leukemia).  Abnormalities of blood vessels behind the eye (carotid cavernous sinus fistula).  Tumors behind the eye.  Certain drugs (aspirin, Coumadin, heparin).  Recent eye surgery. HOME CARE INSTRUCTIONS   Do not worry  about the appearance of your eye. You may continue your usual activities.  Often, follow-up is not necessary. SEEK MEDICAL CARE IF:   Your eye becomes painful.  The bleeding does not disappear within 3 weeks.  Bleeding occurs elsewhere, for example, under the skin, in the mouth, or in the other eye.  You have recurring subconjunctival hemorrhages. SEEK IMMEDIATE MEDICAL CARE IF:   Your vision changes or you have difficulty seeing.  You develop a severe headache, persistent vomiting, confusion, or abnormal drowsiness (lethargy).  Your eye seems to bulge or protrude from the eye socket.  You notice the sudden appearance of bruises or have spontaneous bleeding elsewhere on your body. Document Released: 07/15/2005 Document Revised: 11/29/2013 Document Reviewed: 06/12/2009 [facility] Patient Information 2015 [place], [place]. This information is not intended to replace advice given to you by your health care provider. Make sure you discuss any questions you have with your health care provider.  Iritis Iritis is an inflammation of the colored part of the eye (iris). Other parts at the front of the eye may also be inflamed. The iris is part of the middle layer of the eyeball which is called the uvea or the uveal track. Any part of the uveal track can become inflamed. The other portions of the uveal track are the choroid (the thin membrane under the outer layer of the eye), and the ciliary body (joins the choroid and the iris and produces the fluid in the front of the eye).  It is extremely important to treat iritis  early, as it may lead to internal eye damage causing scarring or diseases such as glaucoma. Some people have only one attack of iritis (in one or both eyes) in their lifetime, while others may get it many times. CAUSES Iritis can be associated with many different diseases, but mostly occurs in otherwise healthy people. Examples of diseases that can be associated with iritis  include:  Diseases where the body's immune system attacks tissues within your own body (autoimmune diseases).  Infections (tuberculosis, gonorrhea, fungus infections, Lyme disease, infection of the lining of the heart).  Trauma or injury.  Eye diseases (acute glaucoma and others).  Inflammation from other parts of the uveal track.  Severe eye infections.  Other rare diseases. SYMPTOMS  Eye pain or aching.  Sensitivity to light.  Loss of sight or blurred vision.  Redness of the eye. This is often accompanied by a ring of redness around the outside of the cornea, or clear covering at the front of the eye (ciliary flush).  Excessive tearing of the eye(s).  A small pupil that does not enlarge in the dark and stays smaller than the other eye's pupil.  A whitish area that obscures the lower part of the colored circular iris. Sometimes this is visible when looking at the eye, where the whitish area has a "fluid level" or flat top. This is called a "hypopyon" and is actually pus inside the eye. Since iritis causes the eye to become red, it is often confused with a much less dangerous form of "pink eye" or conjunctivitis. One of the most important symptoms is sensitivity to light. Anytime there is redness, discomfort in the eye(s) and extreme light sensitivity, it is extremely important to see an ophthalmologist as soon as possible. TREATMENT Acute iritis requires prompt medical evaluation by an eye specialist (ophthalmologist.) Treatment depends on the underlying cause but may include:  Corticosteroid eye drops and dilating eye drops. Follow your caregiver's exact instructions on taking and stopping corticosteroid medications (drops or pills).  Occasionally, the iritis will be so severe that it will not respond to commonly used medications. If this happens, it may be necessary to use steroid injections. The injections are given under the eye's outer surface. Sometimes oral medications are  given. The decision on treatment used for iritis is usually made on an individual basis. HOME CARE INSTRUCTIONS Your care giver will give specific instructions regarding the use of eye medications or other medications. Be certain to follow all instructions in both taking and stopping the medications. SEEK IMMEDIATE MEDICAL CARE IF:  You have redness of one or both eye.  You experience a great deal of light sensitivity.  You have pain or aching in either eye. MAKE SURE YOU:   Understand these instructions.  Will watch your condition.  Will get help right away if you are not doing well or get worse. Document Released: 07/15/2005 Document Revised: 10/07/2011 Document Reviewed: 01/02/2007 [facility] Patient Information 2015 [place]. This information is not intended to replace advice given to you by your health care provider. Make sure you discuss any questions you have with your health care provider.

## 2014-03-03 NOTE — ED Notes (Signed)
Spoke with PA in fast track, ok to place in Fast track

## 2014-03-03 NOTE — ED Notes (Signed)
Pt reports 3 days ago had a paint roller fall off shelf and hit right eye. States since has had headaches. Blood noted to eye. Reports blurred vision and sensitive to light.

## 2014-03-03 NOTE — ED Provider Notes (Signed)
CSN: [id number]     Arrival date & time 03/03/14  1235 History  This chart was scribed for Marlon Peliffany Jahdai Padovano, PA-C, working with Elwin MochaBlair Walden, MD by Leona CarryG. Clay Sherrill, ED Scribe. The patient was seen in TR04C/TR04C. The patient's care was started at 3:14 PM.   Chief Complaint  Patient presents with  . Eye Injury  . Headache   Patient is a 28 y.o. male presenting with eye injury and headaches. The history is provided by the patient. No language interpreter was used.  Eye Injury Associated symptoms include headaches.  Headache Associated symptoms: eye pain and photophobia    HPI Comments: Thomas Harrell is a 28 y.o. male who presents to the Emergency Department complaining of a right eye injury that occurred four days ago. Patient reports associated redness, pain, photophobia, and HA. He states that a a paint roller fell and hit him in the eye while at work. Patient states that he has taken ibuprofen without relief of the pain. Patient denies a history of eye problems. He does not wear glasses or contacts.  PCP is Dr. Ronne BinningMcKenzie.   Past Medical History  Diagnosis Date  . Hypertension    Past Surgical History  Procedure Laterality Date  . Mouth surgery     No family history on file. History  Substance Use Topics  . Smoking status: Current Every Day Smoker -- 1.00 packs/day    Types: Cigarettes  . Smokeless tobacco: Never Used  . Alcohol Use: Yes     Comment: ocassionally    Review of Systems  Eyes: Positive for photophobia, pain and redness.  Neurological: Positive for headaches.  All other systems reviewed and are negative.     Allergies  Review of patient's allergies indicates no known allergies.  Home Medications   Prior to Admission medications   Medication Sig Start Date End Date Taking? Authorizing Provider  HYDROcodone-acetaminophen (NORCO/VICODIN) 5-325 MG per tablet Take 1-2 tablets by mouth every 4 (four) hours as needed. 03/03/14   Shayanna Thatch Irine SealG Rainier Feuerborn, PA-C  ibuprofen  (ADVIL,MOTRIN) 600 MG tablet Take 1 tablet (600 mg total) by mouth every 6 (six) hours as needed. 02/14/14   Brandt LoosenJulie Manly, MD  ondansetron (ZOFRAN) 4 MG tablet Take 1 tablet (4 mg total) by mouth every 6 (six) hours. 03/03/14   Dorthula Matasiffany G Usama Harkless, PA-C   Triage Vitals: BP 131/82  Pulse 74  Temp(Src) 97.9 F (36.6 C) (Oral)  Resp 16  SpO2 100% Physical Exam  Nursing note and vitals reviewed. Constitutional: He is oriented to person, place, and time. He appears well-developed and well-nourished. No distress.  HENT:  Head: Normocephalic and atraumatic.  Eyes: EOM are normal. Right conjunctiva is injected. Right conjunctiva has a hemorrhage. Left conjunctiva is not injected. Left conjunctiva has no hemorrhage.  Fundoscopic exam:      The right eye shows no red reflex.  Slit lamp exam:      The right eye shows no corneal abrasion, no corneal ulcer, no hyphema, no fluorescein uptake and no anterior chamber bulge.  Neck: Neck supple. No tracheal deviation present.  Cardiovascular: Normal rate.   Pulmonary/Chest: Effort normal. No respiratory distress.  Musculoskeletal: Normal range of motion.  Neurological: He is alert and oriented to person, place, and time.  Skin: Skin is warm and dry.  Psychiatric: He has a normal mood and affect. His behavior is normal.    ED Course  Procedures (including critical care time) DIAGNOSTIC STUDIES: Oxygen Saturation is 100% on room air, normal  by my interpretation.    COORDINATION OF CARE: 3:23 PM-Discussed treatment plan which includes ibuprofen, Pontocaine, a fluorescein ophthalmic strip, and a consult to opthalmology with pt at bedside and pt agreed to plan.  I spoke with Dr. Clarisa Kindred regarding the patient, She will see him on Wednesday at 2:30 pm, she recommends prednisolone acetate 1% eye drops written by hand.   ondansetron (ZOFRAN) 4 MG tablet Take 1 tablet (4 mg total) by mouth every 6 (six) hours. 12 tablet Dorthula Matas,  PA-C HYDROcodone-acetaminophen (NORCO/VICODIN) 5-325 MG per tablet Take 1-2 tablets by mouth every 4 (four) hours as needed. 20 tablet Dorthula Matas, PA-C  Labs Review Labs Reviewed - No data to display  Imaging Review No results found.   EKG Interpretation None      MDM   Final diagnoses:  Subconjunctival hemorrhage of right eye  Traumatic iritis   Normal occular pressures 14:26:57 Visual Acuity MR Visual Acuity - Bilateral Distance: 20/20 ; R Distance: 20/30 ; L Distance: 20/20 No corneal abrasion of global fluid drainage.   28 y.o.Thomas Harrell's evaluation in the Emergency Department is complete. It has been determined that no acute conditions requiring further emergency intervention are present at this time. The patient/guardian have been advised of the diagnosis and plan. We have discussed signs and symptoms that warrant return to the ED, such as changes or worsening in symptoms.  Vital signs are stable at discharge. Filed Vitals:   03/03/14 1603  BP: 124/69  Pulse: 60  Temp:   Resp: 16    Patient/guardian has voiced understanding and agreed to follow-up with the PCP or specialist.  I personally performed the services described in this documentation, which was scribed in my presence. The recorded information has been reviewed and is accurate.   Dorthula Matas, PA-C 03/04/14 1546

## 2014-03-05 ENCOUNTER — Emergency Department (HOSPITAL_COMMUNITY)
Admission: EM | Admit: 2014-03-05 | Discharge: 2014-03-05 | Disposition: A | Discharge status: Home / Self Care | Payer: Medicaid Other | Attending: Emergency Medicine | Admitting: Emergency Medicine

## 2014-03-05 ENCOUNTER — Encounter (HOSPITAL_COMMUNITY): Payer: Self-pay | Admitting: Emergency Medicine

## 2014-03-05 DIAGNOSIS — F17200 Nicotine dependence, unspecified, uncomplicated: Secondary | ICD-10-CM | POA: Diagnosis not present

## 2014-03-05 DIAGNOSIS — J029 Acute pharyngitis, unspecified: Secondary | ICD-10-CM | POA: Insufficient documentation

## 2014-03-05 DIAGNOSIS — I10 Essential (primary) hypertension: Secondary | ICD-10-CM | POA: Insufficient documentation

## 2014-03-05 MED ORDER — AMOXICILLIN 500 MG PO CAPS: 500.0000 mg | ORAL_CAPSULE | Freq: Three times a day (TID) | ORAL | Status: DC

## 2014-03-05 NOTE — ED Notes (Signed)
Pt c/o sore throat onset last night. Pt reports being exsposed to a person with strep throat on Thursday.

## 2014-03-05 NOTE — ED Provider Notes (Signed)
Medical screening examination/treatment/procedure(s) were performed by non-physician practitioner and as supervising physician I was immediately available for consultation/collaboration.   EKG Interpretation None        Elwin MochaBlair Rubel Heckard, MD 03/05/14 [phone]

## 2014-03-05 NOTE — ED Notes (Signed)
Pt A&OX4, ambulatory at d/c with steady gait, NAD 

## 2014-03-05 NOTE — ED Provider Notes (Signed)
CSN: [id number]     Arrival date & time 03/05/14  1835 History   First MD Initiated Contact with Patient 03/05/14 1843     This chart was scribed for non-physician practitioner, Roxy Horsemanobert Shelita Steptoe PA-C working with Purvis SheffieldForrest Harrison, MD by Arlan OrganAshley Leger, ED Scribe. This patient was seen in room TR09C/TR09C and the patient's care was started at 6:53 PM.   Chief Complaint  Patient presents with  . Sore Throat   The history is provided by the patient. No language interpreter was used.    HPI Comments: Thomas Harrell is a 28 y.o. male with a PMHx of HTN who presents to the Emergency Department complaining of a constant, moderate sore throat onset last night. Pt admits to a known sick contact 2 days ago recently diagnosed with Step throat. At this time he denies any fever, chills, cough, or congestion. No known allergies to medications. No other concerns this visit.  Past Medical History  Diagnosis Date  . Hypertension    Past Surgical History  Procedure Laterality Date  . Mouth surgery     No family history on file. History  Substance Use Topics  . Smoking status: Current Every Day Smoker -- 1.00 packs/day    Types: Cigarettes  . Smokeless tobacco: Never Used  . Alcohol Use: No     Comment: former    Review of Systems  Constitutional: Negative for fever and chills.  HENT: Positive for sore throat. Negative for congestion.   Respiratory: Negative for cough.       Allergies  Review of patient's allergies indicates no known allergies.  Home Medications   Prior to Admission medications   Medication Sig Start Date End Date Taking? Authorizing Provider  HYDROcodone-acetaminophen (NORCO/VICODIN) 5-325 MG per tablet Take 1-2 tablets by mouth every 4 (four) hours as needed. 03/03/14   Tiffany Irine SealG Greene, PA-C  ibuprofen (ADVIL,MOTRIN) 600 MG tablet Take 1 tablet (600 mg total) by mouth every 6 (six) hours as needed. 02/14/14   Brandt LoosenJulie Manly, MD  ondansetron (ZOFRAN) 4 MG tablet Take 1 tablet (4  mg total) by mouth every 6 (six) hours. 03/03/14   Dorthula Matasiffany G Greene, PA-C   Triage Vitals: BP 128/70  Pulse 76  Temp(Src) 98.4 F (36.9 C) (Oral)  Resp 18  Ht 5' 7.5" (1.715 m)  Wt 183 lb (83.008 kg)  BMI 28.22 kg/m2  SpO2 94%    Physical Exam  Nursing note and vitals reviewed. Constitutional: He is oriented to person, place, and time. He appears well-developed and well-nourished.  HENT:  Head: Normocephalic.  Mouth/Throat: Posterior oropharyngeal erythema present.  No evident of oropharyngeal exudate Uvula is midline Airway intact No evidence of peritonsillar abscess Mild post oropharyngeal erythema  Eyes: EOM are normal.  Neck: Normal range of motion.  Pulmonary/Chest: Effort normal.  Abdominal: He exhibits no distension.  Musculoskeletal: Normal range of motion.  Neurological: He is alert and oriented to person, place, and time.  Psychiatric: He has a normal mood and affect.    ED Course  Procedures (including critical care time)  DIAGNOSTIC STUDIES: Oxygen Saturation is 94% on RA, adequate by my interpretation.    COORDINATION OF CARE: 6:54 PM- Will treat pt for Strep throat. Discussed treatment plan with pt at bedside and pt agreed to plan.     Labs Review Labs Reviewed - No data to display  Imaging Review No results found.   EKG Interpretation None      MDM   Final diagnoses:  Sore  throat    Patient with sore throat.  Recent strep exposure this week.  Will treat and discharge.  No peritonsillar abscess.  I personally performed the services described in this documentation, which was scribed in my presence. The recorded information has been reviewed and is accurate.    Roxy Horseman, PA-C 03/05/14 1910

## 2014-03-05 NOTE — Discharge Instructions (Signed)

## 2014-03-06 NOTE — ED Provider Notes (Signed)
Medical screening examination/treatment/procedure(s) were performed by non-physician practitioner and as supervising physician I was immediately available for consultation/collaboration.   EKG Interpretation None        Froylan Hobby, MD 03/06/14 1231 

## 2014-03-19 ENCOUNTER — Encounter (HOSPITAL_COMMUNITY): Payer: Self-pay | Admitting: Emergency Medicine

## 2014-03-19 ENCOUNTER — Emergency Department (HOSPITAL_COMMUNITY)
Admission: EM | Admit: 2014-03-19 | Discharge: 2014-03-19 | Disposition: A | Discharge status: Home / Self Care | Payer: Medicaid Other | Attending: Emergency Medicine | Admitting: Emergency Medicine

## 2014-03-19 DIAGNOSIS — G44209 Tension-type headache, unspecified, not intractable: Secondary | ICD-10-CM | POA: Diagnosis not present

## 2014-03-19 DIAGNOSIS — Z792 Long term (current) use of antibiotics: Secondary | ICD-10-CM | POA: Diagnosis not present

## 2014-03-19 DIAGNOSIS — I10 Essential (primary) hypertension: Secondary | ICD-10-CM | POA: Diagnosis not present

## 2014-03-19 DIAGNOSIS — F17200 Nicotine dependence, unspecified, uncomplicated: Secondary | ICD-10-CM | POA: Insufficient documentation

## 2014-03-19 DIAGNOSIS — R51 Headache: Secondary | ICD-10-CM | POA: Insufficient documentation

## 2014-03-19 MED ORDER — KETOROLAC TROMETHAMINE 30 MG/ML IJ SOLN
30.0000 mg | Freq: Once | INTRAMUSCULAR | Status: AC
  Administered 2014-03-19: 30 mg via INTRAMUSCULAR
  Filled 2014-03-19: qty 1

## 2014-03-19 NOTE — ED Notes (Signed)
Reported pain level to Dondra SpryGail, NP. Will prepare for discharge.

## 2014-03-19 NOTE — Discharge Instructions (Signed)
General Headache Without Cause  A general headache is pain or discomfort felt around the head or neck area. The cause may not be found.   HOME CARE   · Keep all doctor visits.  · Only take medicines as told by your doctor.  · Lie down in a dark, quiet room when you have a headache.  · Keep a journal to find out if certain things bring on headaches. For example, write down:  ¨ What you eat and drink.  ¨ How much sleep you get.  ¨ Any change to your diet or medicines.  · Relax by getting a massage or doing other relaxing activities.  · Put ice or heat packs on the head and neck area as told by your doctor.  · Lessen stress.  · Sit up straight. Do not tighten (tense) your muscles.  · Quit smoking if you smoke.  · Lessen how much alcohol you drink.  · Lessen how much caffeine you drink, or stop drinking caffeine.  · Eat and sleep on a regular schedule.  · Get 7 to 9 hours of sleep, or as told by your doctor.  · Keep lights dim if bright lights bother you or make your headaches worse.  GET HELP RIGHT AWAY IF:   · Your headache becomes really bad.  · You have a fever.  · You have a stiff neck.  · You have trouble seeing.  · Your muscles are weak, or you lose muscle control.  · You lose your balance or have trouble walking.  · You feel like you will pass out (faint), or you pass out.  · You have really bad symptoms that are different than your first symptoms.  · You have problems with the medicines given to you by your doctor.  · Your medicines do not work.  · Your headache feels different than the other headaches.  · You feel sick to your stomach (nauseous) or throw up (vomit).  MAKE SURE YOU:   · Understand these instructions.  · Will watch your condition.  · Will get help right away if you are not doing well or get worse.  Document Released: 04/23/2008 Document Revised: 10/07/2011 Document Reviewed: 07/05/2011  ExitCare® Patient Information ©2015 [employer]. This information is not intended to replace advice given to  you by your health care provider. Make sure you discuss any questions you have with your health care provider.

## 2014-03-19 NOTE — ED Notes (Signed)
Patient woke with headache this am.  Patient states that he took a vicodin and went back to sleep but woke up with continued headache.  Patient states that he does have HTN but does not take any meds at this time.

## 2014-03-19 NOTE — ED Provider Notes (Signed)
CSN: [id number]     Arrival date & time 03/19/14  2140 History   First MD Initiated Contact with Patient 03/19/14 2218     Chief Complaint  Patient presents with  . Headache     (Consider location/radiation/quality/duration/timing/severity/associated sxs/prior Treatment) HPI Comments: Woke thgia AM with HA-generalized took a left over Vicodan with no relief has not taken any other medication for symptoms   Denies photophobia, congestion, N/V. trauma  Patient is a 28 y.o. male presenting with headaches. The history is provided by the patient.  Headache Pain location:  Generalized Radiates to:  Does not radiate Severity currently:  6/10 Severity at highest:  10/10 Onset quality:  Unable to specify Duration:  12 hours Timing:  Constant Progression:  Unchanged Chronicity:  New Similar to prior headaches: yes   Context: not activity, not exposure to bright light, not caffeine, not stress, not exposure to cold air, not loud noise and not straining   Relieved by:  Nothing Worsened by:  Nothing tried Ineffective treatments: vicodan. Associated symptoms: no blurred vision, no congestion, no dizziness, no pain, no facial pain, no fever, no photophobia, no sinus pressure and no sore throat     Past Medical History  Diagnosis Date  . Hypertension    Past Surgical History  Procedure Laterality Date  . Mouth surgery     History reviewed. No pertinent family history. History  Substance Use Topics  . Smoking status: Current Every Day Smoker -- 1.00 packs/day    Types: Cigarettes  . Smokeless tobacco: Never Used  . Alcohol Use: No     Comment: former    Review of Systems  Constitutional: Negative for fever.  HENT: Negative for congestion, sinus pressure and sore throat.   Eyes: Negative for blurred vision, photophobia and pain.  Neurological: Positive for headaches. Negative for dizziness and weakness.  All other systems reviewed and are negative.     Allergies  Review of  patient's allergies indicates no known allergies.  Home Medications   Prior to Admission medications   Medication Sig Start Date End Date Taking? Authorizing Provider  amoxicillin (AMOXIL) 500 MG capsule Take 1 capsule (500 mg total) by mouth 3 (three) times daily. 03/05/14   Roxy Horsemanobert Browning, PA-C  HYDROcodone-acetaminophen (NORCO/VICODIN) 5-325 MG per tablet Take 1 tablet by mouth every 4 (four) hours as needed for moderate pain.    Historical Provider, MD   BP 140/64  Pulse 70  Temp(Src) 98.5 F (36.9 C) (Oral)  Resp 20  Ht 5\' 7"  (1.702 m)  Wt 180 lb (81.647 kg)  BMI 28.19 kg/m2  SpO2 96% Physical Exam  Nursing note and vitals reviewed. Constitutional: He is oriented to person, place, and time. He appears well-developed and well-nourished.  HENT:  Head: Normocephalic.  Right Ear: External ear normal.  Left Ear: External ear normal.  Mouth/Throat: Oropharynx is clear and moist.  Eyes: Pupils are equal, round, and reactive to light.  Neck: Normal range of motion.  Cardiovascular: Normal rate and regular rhythm.   Pulmonary/Chest: Effort normal.  Musculoskeletal: Normal range of motion.  Neurological: He is alert and oriented to person, place, and time.  Skin: Skin is warm and dry. No erythema.    ED Course  Procedures (including critical care time) Labs Review Labs Reviewed - No data to display  Imaging Review No results found.   EKG Interpretation None      MDM   Final diagnoses:  Tension-type headache, not intractable, unspecified chronicity pattern  Arman Filter, NP 03/19/14 [phone]

## 2014-03-19 NOTE — ED Provider Notes (Signed)
Medical screening examination/treatment/procedure(s) were performed by non-physician practitioner and as supervising physician I was immediately available for consultation/collaboration.   EKG Interpretation None        Jerita Wimbush W Carren Blakley, MD 03/19/14 2325 

## 2014-04-02 ENCOUNTER — Emergency Department (HOSPITAL_COMMUNITY)
Admission: EM | Admit: 2014-04-02 | Discharge: 2014-04-02 | Disposition: A | Discharge status: Home / Self Care | Payer: Medicaid Other | Attending: Emergency Medicine | Admitting: Emergency Medicine

## 2014-04-02 ENCOUNTER — Encounter (HOSPITAL_COMMUNITY): Payer: Self-pay | Admitting: Emergency Medicine

## 2014-04-02 DIAGNOSIS — I10 Essential (primary) hypertension: Secondary | ICD-10-CM | POA: Insufficient documentation

## 2014-04-02 DIAGNOSIS — Y9389 Activity, other specified: Secondary | ICD-10-CM | POA: Insufficient documentation

## 2014-04-02 DIAGNOSIS — Y9289 Other specified places as the place of occurrence of the external cause: Secondary | ICD-10-CM | POA: Diagnosis not present

## 2014-04-02 DIAGNOSIS — S61209A Unspecified open wound of unspecified finger without damage to nail, initial encounter: Secondary | ICD-10-CM | POA: Diagnosis not present

## 2014-04-02 DIAGNOSIS — Z792 Long term (current) use of antibiotics: Secondary | ICD-10-CM | POA: Insufficient documentation

## 2014-04-02 DIAGNOSIS — W268XXA Contact with other sharp object(s), not elsewhere classified, initial encounter: Secondary | ICD-10-CM | POA: Diagnosis not present

## 2014-04-02 DIAGNOSIS — F17200 Nicotine dependence, unspecified, uncomplicated: Secondary | ICD-10-CM | POA: Insufficient documentation

## 2014-04-02 DIAGNOSIS — S61219A Laceration without foreign body of unspecified finger without damage to nail, initial encounter: Secondary | ICD-10-CM

## 2014-04-02 MED ORDER — IBUPROFEN 800 MG PO TABS: 800.0000 mg | ORAL_TABLET | Freq: Three times a day (TID) | ORAL | Status: DC

## 2014-04-02 NOTE — ED Provider Notes (Signed)
CSN: [id number]     Arrival date & time 04/02/14  4696 History   First MD Initiated Contact with Patient 04/02/14 [phone]     Chief Complaint  Patient presents with  . Laceration     (Consider location/radiation/quality/duration/timing/severity/associated sxs/prior Treatment) Patient is a 28 y.o. male presenting with skin laceration. The history is provided by the patient. No language interpreter was used.  Laceration Location:  Hand Hand laceration location:  L finger Depth:  Through dermis Bleeding: controlled   Time since incident:  10 hours Laceration mechanism:  Metal edge Foreign body present:  No foreign bodies Tetanus status:  Up to date   Past Medical History  Diagnosis Date  . Hypertension    Past Surgical History  Procedure Laterality Date  . Mouth surgery     History reviewed. No pertinent family history. History  Substance Use Topics  . Smoking status: Current Every Day Smoker -- 1.00 packs/day    Types: Cigarettes  . Smokeless tobacco: Never Used  . Alcohol Use: No     Comment: former    Review of Systems  Constitutional: Negative for fever and chills.  Musculoskeletal: Negative.   Skin: Positive for wound.       Laceration.  Neurological: Negative.  Negative for numbness.      Allergies  Review of patient's allergies indicates no known allergies.  Home Medications   Prior to Admission medications   Medication Sig Start Date End Date Taking? Authorizing Provider  amoxicillin (AMOXIL) 500 MG capsule Take 1 capsule (500 mg total) by mouth 3 (three) times daily. 03/05/14   Roxy Horseman, PA-C  HYDROcodone-acetaminophen (NORCO/VICODIN) 5-325 MG per tablet Take 1 tablet by mouth every 4 (four) hours as needed for moderate pain.    Historical Provider, MD   There were no vitals taken for this visit. Physical Exam  Constitutional: He is oriented to person, place, and time. He appears well-developed and well-nourished. No distress.  Musculoskeletal:  Normal range of motion.  No tendon deficits left 4th finger.   Neurological: He is alert and oriented to person, place, and time.  Neurovascularly intact.   Skin:  3.5 cm laceration of left 4th finger involving palmar finger over middle phalanx extending into distal phalanx. Minimal swelling.   Psychiatric: He has a normal mood and affect.    ED Course  Procedures (including critical care time) Labs Review Labs Reviewed - No data to display LACERATION REPAIR Performed by: Elpidio Anis A Authorized by: Elpidio Anis A Consent: Verbal consent obtained. Risks and benefits: risks, benefits and alternatives were discussed Consent given by: patient Patient identity confirmed: provided demographic data Prepped and Draped in normal sterile fashion Wound explored  Laceration Location: left 4th finger  Laceration Length: 3.5cm  No Foreign Bodies seen or palpated  Anesthesia: local infiltration  Local anesthetic: lidocaine 2% w/o epinephrine  Anesthetic total: 4 ml via digital block  Irrigation method: syringe Amount of cleaning: standard  Skin closure: 4-0 prolene  Number of sutures: 6  Technique: simple interrupted  Patient tolerance: Patient tolerated the procedure well with no immediate complications.  Imaging Review No results found.   EKG Interpretation None      MDM   Final diagnoses:  None    1. Finger laceration  Finger laceration without tendon deficit. Care instructions given. Patient is tachycardic but normal vital signs otherwise in simple, uncomplicated laceration.     Arnoldo Hooker, PA-C 04/02/14 [phone]

## 2014-04-02 NOTE — ED Notes (Signed)
Pt reports cutting his LT ring finger at 2200 last night while moving a dryer.

## 2014-04-02 NOTE — ED Notes (Signed)
Declined W/C at D/C and was escorted to lobby by RN. 

## 2014-04-02 NOTE — Discharge Instructions (Signed)
Sutured Wound Care °Sutures are stitches that can be used to close wounds. Wound care helps prevent pain and infection.  °HOME CARE INSTRUCTIONS  °· Rest and elevate the injured area until all the pain and swelling are gone. °· Only take over-the-counter or prescription medicines for pain, discomfort, or fever as directed by your caregiver. °· After 48 hours, gently wash the area with mild soap and water once a day, or as directed. Rinse off the soap. Pat the area dry with a clean towel. Do not rub the wound. This may cause bleeding. °· Follow your caregiver's instructions for how often to change the bandage (dressing). Stop using a dressing after 2 days or after the wound stops draining. °· If the dressing sticks, moisten it with soapy water and gently remove it. °· Apply ointment on the wound as directed. °· Avoid stretching a sutured wound. °· Drink enough fluids to keep your urine clear or pale yellow. °· Follow up with your caregiver for suture removal as directed. °· Use sunscreen on your wound for the next 3 to 6 months so the scar will not darken. °SEEK IMMEDIATE MEDICAL CARE IF:  °· Your wound becomes red, swollen, hot, or tender. °· You have increasing pain in the wound. °· You have a red streak that extends from the wound. °· There is pus coming from the wound. °· You have a fever. °· You have shaking chills. °· There is a bad smell coming from the wound. °· You have persistent bleeding from the wound. °MAKE SURE YOU:  °· Understand these instructions. °· Will watch your condition. °· Will get help right away if you are not doing well or get worse. °Document Released: 08/22/2004 Document Revised: 10/07/2011 Document Reviewed: 11/18/2010 °ExitCare® Patient Information ©2015 [employer]. This information is not intended to replace advice given to you by your health care provider. Make sure you discuss any questions you have with your health care provider. ° °

## 2014-04-05 ENCOUNTER — Emergency Department (HOSPITAL_COMMUNITY)
Admission: EM | Admit: 2014-04-05 | Discharge: 2014-04-05 | Disposition: A | Discharge status: Home / Self Care | Payer: Medicaid Other | Attending: Emergency Medicine | Admitting: Emergency Medicine

## 2014-04-05 ENCOUNTER — Encounter (HOSPITAL_COMMUNITY): Payer: Self-pay | Admitting: Emergency Medicine

## 2014-04-05 DIAGNOSIS — F17200 Nicotine dependence, unspecified, uncomplicated: Secondary | ICD-10-CM | POA: Insufficient documentation

## 2014-04-05 DIAGNOSIS — Z4801 Encounter for change or removal of surgical wound dressing: Secondary | ICD-10-CM | POA: Insufficient documentation

## 2014-04-05 DIAGNOSIS — Z0289 Encounter for other administrative examinations: Secondary | ICD-10-CM | POA: Diagnosis present

## 2014-04-05 DIAGNOSIS — I10 Essential (primary) hypertension: Secondary | ICD-10-CM | POA: Insufficient documentation

## 2014-04-05 DIAGNOSIS — Z5189 Encounter for other specified aftercare: Secondary | ICD-10-CM

## 2014-04-05 NOTE — Discharge Instructions (Signed)
Call for a follow up appointment with a Family or Primary Care Provider.  You may return to work immediately, keep a dressing on during working hours. Return if Symptoms worsen.   Take medication as prescribed.  Return in 5-8 days for your suture removal as previously instructed. Keep the wound clean and dry.

## 2014-04-05 NOTE — ED Provider Notes (Signed)
CSN: [id number]     Arrival date & time 04/05/14  1746 History  This chart was scribed for Mellody Drown, PA-C working with Arby Barrette, MD by Evon Slack, ED Scribe. This patient was seen in room TR05C/TR05C and the patient's care was started at 6:24 PM.     Chief Complaint  Patient presents with  . Letter for School/Work   HPI Comments: Thomas Harrell is a 28 y.o. male who presents to the Emergency Department for an excuse letter for work. He states that he didn't go to work today due to pain in his finger. He states he works for [employer] and didn't go in at all today.  He states he also needs the dressing changed, was unaware they sold wound supplies at drug stores, [employer], [employer].  PCP: Billee Cashing, MD     Past Medical History  Diagnosis Date  . Hypertension    Past Surgical History  Procedure Laterality Date  . Mouth surgery     No family history on file. History  Substance Use Topics  . Smoking status: Current Every Day Smoker -- 1.00 packs/day    Types: Cigarettes  . Smokeless tobacco: Never Used  . Alcohol Use: No     Comment: former    Review of Systems  Constitutional: Negative for fever and chills.  Skin: Positive for wound.      Allergies  Review of patient's allergies indicates no known allergies.  Home Medications   Prior to Admission medications   Not on File   Triage Vitals: BP 138/76  Pulse 73  Temp(Src) 98.8 F (37.1 C) (Oral)  Resp 18  SpO2 97%  Physical Exam  Nursing note and vitals reviewed. Constitutional: He is oriented to person, place, and time. He appears well-developed and well-nourished. No distress.  HENT:  Head: Normocephalic and atraumatic.  Neck: Neck supple.  Pulmonary/Chest: Effort normal. No respiratory distress.  Musculoskeletal: Normal range of motion.  Left hand: 6 sutures intact no erythema or drainage.  Neurological: He is alert and oriented to person, place, and time.  Skin: Skin is warm and  dry. He is not diaphoretic.  Psychiatric: He has a normal mood and affect. His behavior is normal.    ED Course  Procedures (including critical care time) Labs Review Labs Reviewed - No data to display  Imaging Review No results found.   EKG Interpretation None      MDM   Final diagnoses:  Visit for wound check    Patient presents to emergency room at 5:45 PM due to pain and finger, sutures placed 04/02/2014. The patient was extremely rude and demanded a note for work today. Wound intact, no sign of infection, afebrile. I will not be giving the patient a work note, after the work day has concluded.  I personally performed the services described in this documentation, which was scribed in my presence. The recorded information has been reviewed and is accurate.      Mellody Drown, PA-C 04/06/14 [phone]

## 2014-04-05 NOTE — ED Notes (Signed)
Pt recently got stiches and had note that covered him for work until 9/6. Pt states he needs a note to cover him until 9/9. Pt denies any new complaint. Denies pain.

## 2014-04-05 NOTE — ED Provider Notes (Signed)
Medical screening examination/treatment/procedure(s) were performed by non-physician practitioner and as supervising physician I was immediately available for consultation/collaboration.   EKG Interpretation None       Denisa Enterline, MD 04/05/14 1114 

## 2014-04-07 NOTE — ED Provider Notes (Signed)
Medical screening examination/treatment/procedure(s) were performed by non-physician practitioner and as supervising physician I was immediately available for consultation/collaboration.   EKG Interpretation None       Arby Barrette, MD 04/07/14 [phone]

## 2014-04-11 ENCOUNTER — Encounter (HOSPITAL_COMMUNITY): Payer: Self-pay | Admitting: Emergency Medicine

## 2014-04-11 ENCOUNTER — Emergency Department (HOSPITAL_COMMUNITY)
Admission: EM | Admit: 2014-04-11 | Discharge: 2014-04-11 | Disposition: A | Discharge status: Home / Self Care | Payer: Medicaid Other | Attending: Emergency Medicine | Admitting: Emergency Medicine

## 2014-04-11 DIAGNOSIS — Z4802 Encounter for removal of sutures: Secondary | ICD-10-CM | POA: Diagnosis present

## 2014-04-11 DIAGNOSIS — F17200 Nicotine dependence, unspecified, uncomplicated: Secondary | ICD-10-CM | POA: Diagnosis not present

## 2014-04-11 DIAGNOSIS — I10 Essential (primary) hypertension: Secondary | ICD-10-CM | POA: Insufficient documentation

## 2014-04-11 NOTE — ED Provider Notes (Signed)
CSN: [id number]     Arrival date & time 04/11/14  1622 History  This chart was scribed for non-physician practitioner, Junious Silk, PA-C,working with Flint Melter, MD, by Karle Plumber, ED Scribe. This patient was seen in room TR07C/TR07C and the patient's care was started at 5:37 PM.  Chief Complaint  Patient presents with  . Suture / Staple Removal   Patient is a 28 y.o. male presenting with suture removal. The history is provided by the patient. No language interpreter was used.  Suture / Staple Removal   HPI Comments:  Thomas Harrell is a 28 y.o. male who presents to the Emergency Department needing sutures removed from the palmar aspect of his left fourth finger that were placed 10 days ago. Pt states he cut his finger on a stove causing him to need 6 sutures. He reports some mild tenderness to the area. He denies fever, chills, drainage, warmth or redness of the area.  Past Medical History  Diagnosis Date  . Hypertension    Past Surgical History  Procedure Laterality Date  . Mouth surgery     No family history on file. History  Substance Use Topics  . Smoking status: Current Every Day Smoker -- 1.00 packs/day    Types: Cigarettes  . Smokeless tobacco: Never Used  . Alcohol Use: No     Comment: former    Review of Systems  Skin: Positive for wound.  All other systems reviewed and are negative.   Allergies  Review of patient's allergies indicates no known allergies.  Home Medications   Prior to Admission medications   Not on File   Triage Vitals: BP 143/70  Pulse 66  Temp(Src) 99.3 F (37.4 C) (Oral)  Resp 15  SpO2 96% Physical Exam  Nursing note and vitals reviewed. Constitutional: He is oriented to person, place, and time. He appears well-developed and well-nourished. No distress.  HENT:  Head: Normocephalic and atraumatic.  Right Ear: External ear normal.  Left Ear: External ear normal.  Nose: Nose normal.  Eyes: Conjunctivae are normal.   Neck: Normal range of motion. No tracheal deviation present.  Cardiovascular: Normal rate, regular rhythm and normal heart sounds.   Pulmonary/Chest: Effort normal and breath sounds normal. No stridor.  Abdominal: Soft. He exhibits no distension. There is no tenderness.  Musculoskeletal: Normal range of motion.  Neurological: He is alert and oriented to person, place, and time.  Skin: Skin is warm and dry. He is not diaphoretic. No erythema.  6 sutures in place on palmar aspect of left fourth finger. No erythema, swelling or discharge.  Psychiatric: He has a normal mood and affect. His behavior is normal.    ED Course  Procedures (including critical care time) DIAGNOSTIC STUDIES: Oxygen Saturation is 96% on RA, adequate by my interpretation.   COORDINATION OF CARE: 5:41 PM- Removed 6 sutures. Pt verbalizes understanding and agrees to plan.  Medications - No data to display  Labs Review Labs Reviewed - No data to display  Imaging Review No results found.   EKG Interpretation None      SUTURE REMOVAL Performed by: Junious Silk  Consent: Verbal consent obtained. Patient identity confirmed: provided demographic data Time out: Immediately prior to procedure a "time out" was called to verify the correct patient, procedure, equipment, support staff and site/side marked as required.  Location details: left 4th finger  Wound Appearance: clean  Sutures/Staples Removed: 6  Facility: sutures placed in this facility Patient tolerance: Patient tolerated the procedure  well with no immediate complications.     MDM   Final diagnoses:  Visit for suture removal   Pt to ER for staple/suture removal and wound check as above. Procedure tolerated well. Vitals normal, no signs of infection. Scar minimization & return precautions given at dc.    I personally performed the services described in this documentation, which was scribed in my presence. The recorded information has  been reviewed and is accurate.    Mora Bellman, PA-C 04/12/14 0200

## 2014-04-11 NOTE — ED Notes (Signed)
Suture removal for Left ring finger.

## 2014-04-11 NOTE — Discharge Instructions (Signed)
Scar Minimization °You will have a scar anytime you have surgery and a cut is made in the skin or you have something removed from your skin (mole, skin cancer, cyst). Although scars are unavoidable following surgery, there are ways to minimize their appearance. °It is important to follow all the instructions you receive from your caregiver about wound care. How your wound heals will influence the appearance of your scar. If you do not follow the wound care instructions as directed, complications such as infection may occur. Wound instructions include keeping the wound clean, moist, and not letting the wound form a scab. Some people form scars that are raised and lumpy (hypertrophic) or larger than the initial wound (keloidal). °HOME CARE INSTRUCTIONS  °· Follow wound care instructions as directed. °· Keep the wound clean by washing it with soap and water. °· Keep the wound moist with provided antibiotic cream or petroleum jelly until completely healed. Moisten twice a day for about 2 weeks. °· Get stitches (sutures) taken out at the scheduled time. °· Avoid touching or manipulating your wound unless needed. Wash your hands thoroughly before and after touching your wound. °· Follow all restrictions such as limits on exercise or work. This depends on where your scar is located. °· Keep the scar protected from sunburn. Cover the scar with sunscreen/sunblock with SPF 30 or higher. °· Gently massage the scar using a circular motion to help minimize the appearance of the scar. Do this only after the wound has closed and all the sutures have been removed. °· For hypertrophic or keloidal scars, there are several ways to treat and minimize their appearance. Methods include compression therapy, intralesional corticosteroids, laser therapy, or surgery. These methods are performed by your caregiver. °Remember that the scar may appear lighter or darker than your normal skin color. This difference in color should even out with  time. °SEEK MEDICAL CARE IF:  °· You have a fever. °· You develop signs of infection such as pain, redness, pus, and warmth. °· You have questions or concerns. °Document Released: 01/02/2010 Document Revised: 10/07/2011 Document Reviewed: 01/02/2010 °ExitCare® Patient Information ©2015 [employer]. This information is not intended to replace advice given to you by your health care provider. Make sure you discuss any questions you have with your health care provider. ° °Suture Removal, Care After °Refer to this sheet in the next few weeks. These instructions provide you with information on caring for yourself after your procedure. Your health care provider may also give you more specific instructions. Your treatment has been planned according to current medical practices, but problems sometimes occur. Call your health care provider if you have any problems or questions after your procedure. °WHAT TO EXPECT AFTER THE PROCEDURE °After your stitches (sutures) are removed, it is typical to have the following: °· Some discomfort and swelling in the wound area. °· Slight redness in the area. °HOME CARE INSTRUCTIONS  °· If you have skin adhesive strips over the wound area, do not take the strips off. They will fall off on their own in a few days. If the strips remain in place after 14 days, you may remove them. °· Change any bandages (dressings) at least once a day or as directed by your health care provider. If the bandage sticks, soak it off with warm, soapy water. °· Apply cream or ointment only as directed by your health care provider. If using cream or ointment, wash the area with soap and water 2 times a day to remove   all the cream or ointment. Rinse off the soap and pat the area dry with a clean towel. °· Keep the wound area dry and clean. If the bandage becomes wet or dirty, or if it develops a bad smell, change it as soon as possible. °· Continue to protect the wound from injury. °· Use sunscreen when out in the  sun. New scars become sunburned easily. °SEEK MEDICAL CARE IF: °· You have increasing redness, swelling, or pain in the wound. °· You see pus coming from the wound. °· You have a fever. °· You notice a bad smell coming from the wound or dressing. °· Your wound breaks open (edges not staying together). °Document Released: 04/09/2001 Document Revised: 05/05/2013 Document Reviewed: 02/24/2013 °ExitCare® Patient Information ©2015 [employer]. This information is not intended to replace advice given to you by your health care provider. Make sure you discuss any questions you have with your health care provider. ° °

## 2014-04-11 NOTE — ED Notes (Signed)
Here to stitches removal to left ring finger. Placed Saturday. No problems

## 2014-04-11 NOTE — ED Notes (Signed)
PA at bedside.

## 2014-04-13 NOTE — ED Provider Notes (Signed)
Medical screening examination/treatment/procedure(s) were performed by non-physician practitioner and as supervising physician I was immediately available for consultation/collaboration.  Kimsey Demaree L Marquiz Sotelo, MD 04/13/14 0743 

## 2014-04-27 ENCOUNTER — Encounter (HOSPITAL_COMMUNITY): Payer: Self-pay | Admitting: Emergency Medicine

## 2014-04-27 ENCOUNTER — Emergency Department (HOSPITAL_COMMUNITY): Payer: Medicaid Other

## 2014-04-27 ENCOUNTER — Emergency Department (HOSPITAL_COMMUNITY)
Admission: EM | Admit: 2014-04-27 | Discharge: 2014-04-28 | Disposition: A | Discharge status: Home / Self Care | Payer: Medicaid Other | Attending: Emergency Medicine | Admitting: Emergency Medicine

## 2014-04-27 DIAGNOSIS — S339XXA Sprain of unspecified parts of lumbar spine and pelvis, initial encounter: Secondary | ICD-10-CM | POA: Insufficient documentation

## 2014-04-27 DIAGNOSIS — Y990 Civilian activity done for income or pay: Secondary | ICD-10-CM | POA: Diagnosis not present

## 2014-04-27 DIAGNOSIS — I10 Essential (primary) hypertension: Secondary | ICD-10-CM | POA: Insufficient documentation

## 2014-04-27 DIAGNOSIS — M545 Low back pain, unspecified: Secondary | ICD-10-CM | POA: Diagnosis present

## 2014-04-27 DIAGNOSIS — Y9289 Other specified places as the place of occurrence of the external cause: Secondary | ICD-10-CM | POA: Diagnosis not present

## 2014-04-27 DIAGNOSIS — X500XXA Overexertion from strenuous movement or load, initial encounter: Secondary | ICD-10-CM | POA: Diagnosis not present

## 2014-04-27 DIAGNOSIS — Y9389 Activity, other specified: Secondary | ICD-10-CM | POA: Diagnosis not present

## 2014-04-27 DIAGNOSIS — S39012A Strain of muscle, fascia and tendon of lower back, initial encounter: Secondary | ICD-10-CM

## 2014-04-27 DIAGNOSIS — F17210 Nicotine dependence, cigarettes, uncomplicated: Secondary | ICD-10-CM | POA: Insufficient documentation

## 2014-04-27 DIAGNOSIS — F17200 Nicotine dependence, unspecified, uncomplicated: Secondary | ICD-10-CM | POA: Diagnosis not present

## 2014-04-27 DIAGNOSIS — IMO0002 Reserved for concepts with insufficient information to code with codable children: Secondary | ICD-10-CM | POA: Diagnosis not present

## 2014-04-27 MED ORDER — IBUPROFEN 600 MG PO TABS: 600.0000 mg | ORAL_TABLET | Freq: Four times a day (QID) | ORAL | Status: DC | PRN

## 2014-04-27 MED ORDER — CYCLOBENZAPRINE HCL 5 MG PO TABS: 5.0000 mg | ORAL_TABLET | Freq: Three times a day (TID) | ORAL | Status: DC | PRN

## 2014-04-27 NOTE — ED Provider Notes (Signed)
CSN: [id number]     Arrival date & time 04/27/14  2152 History   First MD Initiated Contact with Patient 04/27/14 2336     Chief Complaint  Patient presents with  . 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  When he felt a strain in his lower back.  Continue to work for the rest of the day went home went to bed without taking any medication.  When he woke up this morning he was stiff.  He has not taken any over-the-counter medication for his symptoms, and apply ice or heat.  Is requesting a note to return to work as he "has bills to pay."  Patient is a 28 y.o. male presenting with back pain. The history is provided by the patient.  Back Pain Location:  Lumbar spine Quality:  Aching Radiates to:  Does not radiate Pain severity:  Mild Pain is:  Same all the time Onset quality:  Gradual Timing:  Constant Progression:  Unchanged Chronicity:  Recurrent Relieved by:  None tried Worsened by:  Twisting Ineffective treatments:  Bed rest and being still Associated symptoms: no bowel incontinence, no dysuria, no leg pain, no numbness and no weakness     Past Medical History  Diagnosis Date  . Hypertension    Past Surgical History  Procedure Laterality Date  . Mouth surgery     No family history on file. History  Substance Use Topics  . Smoking status: Current Every Day Smoker -- 1.00 packs/day    Types: Cigarettes  . Smokeless tobacco: Never Used  . Alcohol Use: No     Comment: former    Review of Systems  Gastrointestinal: Negative for bowel incontinence.  Genitourinary: Negative for dysuria.  Musculoskeletal: Positive for back pain. Negative for neck pain.  Skin: Negative for rash.  Neurological: Negative for weakness and numbness.  All other systems  reviewed and are negative.     Allergies  Review of patient's allergies indicates no known allergies.  Home Medications   Prior to Admission medications   Medication Sig Start Date End Date Taking? Authorizing Provider  cyclobenzaprine (FLEXERIL) 5 MG tablet Take 1 tablet (5 mg total) by mouth 3 (three) times daily as needed for muscle spasms. 04/27/14   Arman FilterGail K Kessie Croston, NP  ibuprofen (ADVIL,MOTRIN) 600 MG tablet Take 1 tablet (600 mg total) by mouth every 6 (six) hours as needed. 04/27/14   Arman FilterGail K Jahmani Staup, NP   BP 152/73  Pulse 75  Temp(Src) 97.7 F (36.5 C) (Oral)  Resp 16  Ht 5\' 8"  (1.727 m)  Wt 180 lb (81.647 kg)  BMI 27.38 kg/m2  SpO2 98% Physical Exam  Nursing note and vitals reviewed. Constitutional: He appears well-developed and well-nourished.  HENT:  Head: Normocephalic.  Eyes: Pupils are equal, round, and reactive to light.  Neck: Normal range of motion. No spinous process tenderness and no muscular tenderness present.  Cardiovascular: Normal rate.   Musculoskeletal: Normal range of motion.  Neurological: He is alert.  Skin: Skin is warm. No rash noted.    ED Course  Procedures (including critical care time) Labs Review Labs Reviewed - No data to display  Imaging Review Dg Lumbar Spine Complete  04/27/2014   CLINICAL DATA:  Low back pain radiating down right leg  EXAM: LUMBAR SPINE - COMPLETE 4+ VIEW  COMPARISON:  CT  abdomen pelvis dated 02/18/2010  FINDINGS: Five lumbar type vertebral bodies.  Mild straightening of the lumbar spine.  No evidence of fracture or dislocation. Vertebral body heights and intervertebral disc spaces are maintained.  Visualized bony pelvis appears intact.  IMPRESSION: Normal lumbar spine radiographs.   Electronically Signed   By: Charline Bills M.D.   On: 04/27/2014 22:48     EKG Interpretation None      MDM   Final diagnoses:  Lumbar strain, initial encounter         Arman Filter, NP 04/27/14 2349

## 2014-04-27 NOTE — ED Notes (Signed)
Pt. 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

## 2014-04-28 NOTE — ED Provider Notes (Signed)
Medical screening examination/treatment/procedure(s) were performed by non-physician practitioner and as supervising physician I was immediately available for consultation/collaboration.   EKG Interpretation None        Tomasita CrumbleAdeleke Mandie Crabbe, MD 04/28/14 [phone]

## 2014-05-23 ENCOUNTER — Emergency Department (HOSPITAL_COMMUNITY): Payer: Medicaid Other

## 2014-05-23 ENCOUNTER — Emergency Department (HOSPITAL_COMMUNITY)
Admission: EM | Admit: 2014-05-23 | Discharge: 2014-05-24 | Disposition: A | Discharge status: Home / Self Care | Payer: Medicaid Other | Attending: Emergency Medicine | Admitting: Emergency Medicine

## 2014-05-23 ENCOUNTER — Encounter (HOSPITAL_COMMUNITY): Payer: Self-pay | Admitting: Emergency Medicine

## 2014-05-23 DIAGNOSIS — I10 Essential (primary) hypertension: Secondary | ICD-10-CM | POA: Diagnosis not present

## 2014-05-23 DIAGNOSIS — Z720 Tobacco use: Secondary | ICD-10-CM | POA: Diagnosis not present

## 2014-05-23 DIAGNOSIS — M25519 Pain in unspecified shoulder: Secondary | ICD-10-CM | POA: Insufficient documentation

## 2014-05-23 DIAGNOSIS — R079 Chest pain, unspecified: Secondary | ICD-10-CM | POA: Diagnosis present

## 2014-05-23 DIAGNOSIS — R0789 Other chest pain: Secondary | ICD-10-CM | POA: Insufficient documentation

## 2014-05-23 LAB — BASIC METABOLIC PANEL
Anion gap: 13 (ref 5–15)
BUN: 8 mg/dL (ref 6–23)
CHLORIDE: 102 meq/L (ref 96–112)
CO2: 23 mEq/L (ref 19–32)
Calcium: 9.2 mg/dL (ref 8.4–10.5)
Creatinine, Ser: 1.18 mg/dL (ref 0.50–1.35)
GFR calc non Af Amer: 83 mL/min — ABNORMAL LOW (ref >90)
GLUCOSE: 87 mg/dL (ref 70–99)
POTASSIUM: 4 meq/L (ref 3.7–5.3)
Sodium: 138 mEq/L (ref 137–147)

## 2014-05-23 LAB — I-STAT TROPONIN, ED: TROPONIN I, POC: 0 ng/mL (ref 0.00–0.08)

## 2014-05-23 LAB — CBC
HEMATOCRIT: 44 % (ref 39.0–52.0)
HEMOGLOBIN: 14.6 g/dL (ref 13.0–17.0)
MCH: 27.7 pg (ref 26.0–34.0)
MCHC: 33.2 g/dL (ref 30.0–36.0)
MCV: 83.5 fL (ref 78.0–100.0)
Platelets: 297 10*3/uL (ref 150–400)
RBC: 5.27 MIL/uL (ref 4.22–5.81)
RDW: 12.4 % (ref 11.5–15.5)
WBC: 11.5 10*3/uL — ABNORMAL HIGH (ref 4.0–10.5)

## 2014-05-23 NOTE — ED Notes (Signed)
Intermittent chest pain onset 4 days ago, worse when lying still. Denies SOB, no lightheadedness/ dizziness, no recent cough or fevers. Pt reports intermittent nausea.

## 2014-05-23 NOTE — ED Notes (Signed)
Pt states that he has been having left sided chest, shoulder, arm, rib pain for 2 days; pt states that it is a sharp aching radiating down his right side; pt states that it has been going on for 2 days and got worse this afternoon; pt denies any other sx; pt sitting in triage in no acute distress; resp even and unlabored; [facility]

## 2014-05-24 NOTE — ED Provider Notes (Signed)
Medical screening examination/treatment/procedure(s) were performed by non-physician practitioner and as supervising physician I was immediately available for consultation/collaboration.   EKG Interpretation   Date/Time:  Monday May 23 2014 20:49:48 EDT Ventricular Rate:  76 PR Interval:  177 QRS Duration: 81 QT Interval:  355 QTC Calculation: 399 R Axis:   43 Text Interpretation:  Sinus rhythm Normal ECG Confirmed by Blinda LeatherwoodPOLLINA  MD,  CHRISTOPHER ([id number]) on 05/23/2014 9:05:14 PM        Hanley SeamenJohn L Lounell Schumacher, MD 05/24/14 [phone]

## 2014-05-24 NOTE — ED Provider Notes (Signed)
CSN: [id number]     Arrival date & time 05/23/14  2045 History   First MD Initiated Contact with Patient 05/23/14 2313     Chief Complaint  Patient presents with  . Chest Pain     (Consider location/radiation/quality/duration/timing/severity/associated sxs/prior Treatment) HPI Comments: Patient presents to the emergency department with chief complaint of left-sided chest, shoulder, arm, and rib pain 2 days. Denies any known mechanism of injury. He states the pain radiates. Denies any associated shortness breath or diaphoresis. Denies any cardiac history. Denies any history of PE or DVT. There are no aggravating or alleviating factors. He has not tried taking anything to alleviate his symptoms.  The history is provided by the patient. No language interpreter was used.    Past Medical History  Diagnosis Date  . Hypertension    Past Surgical History  Procedure Laterality Date  . Mouth surgery     No family history on file. History  Substance Use Topics  . Smoking status: Current Every Day Smoker -- 0.50 packs/day    Types: Cigarettes  . Smokeless tobacco: Never Used  . Alcohol Use: No     Comment: former    Review of Systems  Constitutional: Negative for fever and chills.  Respiratory: Negative for shortness of breath.   Cardiovascular: Positive for chest pain.  Gastrointestinal: Negative for nausea, vomiting, diarrhea and constipation.  Genitourinary: Negative for dysuria.  All other systems reviewed and are negative.     Allergies  Review of patient's allergies indicates no known allergies.  Home Medications   Prior to Admission medications   Not on File   BP 125/79  Pulse 71  Temp(Src) 98.5 F (36.9 C) (Oral)  Resp 16  Ht 5\' 8"  (1.727 m)  Wt 180 lb (81.647 kg)  BMI 27.38 kg/m2  SpO2 98% Physical Exam  Nursing note and vitals reviewed. Constitutional: He is oriented to person, place, and time. He appears well-developed and well-nourished.  HENT:   Head: Normocephalic and atraumatic.  Eyes: Conjunctivae and EOM are normal. Pupils are equal, round, and reactive to light. Right eye exhibits no discharge. Left eye exhibits no discharge. No scleral icterus.  Neck: Normal range of motion. Neck supple. No JVD present.  Cardiovascular: Normal rate, regular rhythm and normal heart sounds.  Exam reveals no gallop and no friction rub.   No murmur heard. Pulmonary/Chest: Effort normal and breath sounds normal. No respiratory distress. He has no wheezes. He has no rales. He exhibits no tenderness.  Abdominal: Soft. He exhibits no distension and no mass. There is no tenderness. There is no rebound and no guarding.  Musculoskeletal: Normal range of motion. He exhibits no edema and no tenderness.  Moves all extremities, range of motion and strength 5/5 throughout  Neurological: He is alert and oriented to person, place, and time.  Sensation and strength intact throughout, CN 3-12 intact  Skin: Skin is warm and dry.  Psychiatric: He has a normal mood and affect. His behavior is normal. Judgment and thought content normal.    ED Course  Procedures (including critical care time) Results for orders placed during the hospital encounter of 05/23/14  CBC      Result Value Ref Range   WBC 11.5 (*) 4.0 - 10.5 K/uL   RBC 5.27  4.22 - 5.81 MIL/uL   Hemoglobin 14.6  13.0 - 17.0 g/dL   HCT 57.844.0  46.939.0 - 62.952.0 %   MCV 83.5  78.0 - 100.0 fL   MCH 27.7  26.0 - 34.0 pg   MCHC 33.2  30.0 - 36.0 g/dL   RDW 14.712.4  82.911.5 - 56.215.5 %   Platelets 297  150 - 400 K/uL  BASIC METABOLIC PANEL      Result Value Ref Range   Sodium 138  137 - 147 mEq/L   Potassium 4.0  3.7 - 5.3 mEq/L   Chloride 102  96 - 112 mEq/L   CO2 23  19 - 32 mEq/L   Glucose, Bld 87  70 - 99 mg/dL   BUN 8  6 - 23 mg/dL   Creatinine, Ser 1.301.18  0.50 - 1.35 mg/dL   Calcium 9.2  8.4 - 86.510.5 mg/dL   GFR calc non Af Amer 83 (*) >90 mL/min   GFR calc Af Amer >90  >90 mL/min   Anion gap 13  5 - 15   I-STAT TROPOININ, ED      Result Value Ref Range   Troponin i, poc 0.00  0.00 - 0.08 ng/mL   Comment 3            Dg Chest 2 View  05/23/2014   CLINICAL DATA:  Left-sided chest pain, shortness of breath.  EXAM: CHEST  2 VIEW  COMPARISON:  03/09/2011  FINDINGS: The heart size and mediastinal contours are within normal limits. Both lungs are clear. The visualized skeletal structures are unremarkable.  IMPRESSION: No radiographic evidence of active cardiopulmonary disease.   Electronically Signed   By: Jearld LeschAndrew  DelGaizo M.D.   On: 05/23/2014 21:51   Dg Lumbar Spine Complete  04/27/2014   CLINICAL DATA:  Low back pain radiating down right leg  EXAM: LUMBAR SPINE - COMPLETE 4+ VIEW  COMPARISON:  CT abdomen pelvis dated 02/18/2010  FINDINGS: Five lumbar type vertebral bodies.  Mild straightening of the lumbar spine.  No evidence of fracture or dislocation. Vertebral body heights and intervertebral disc spaces are maintained.  Visualized bony pelvis appears intact.  IMPRESSION: Normal lumbar spine radiographs.   Electronically Signed   By: Charline BillsSriyesh  Krishnan M.D.   On: 04/27/2014 22:48      EKG Interpretation   Date/Time:  Monday May 23 2014 20:49:48 EDT Ventricular Rate:  76 PR Interval:  177 QRS Duration: 81 QT Interval:  355 QTC Calculation: 399 R Axis:   43 Text Interpretation:  Sinus rhythm Normal ECG Confirmed by POLLINA  MD,  CHRISTOPHER [phone]([id number]) on 05/23/2014 9:05:14 PM      MDM   Final diagnoses:  Chest pain, unspecified chest pain type    Patient with left-sided chest pain, shoulder, and arm pain. Patient states symptoms have been ongoing for the past 2 days. States that they recently worsened. No associated shortness breath. No cardiac history, no DVT, or PE history. PERC negative, heart score is 1, extremely low risk for heart or lung disease. Neurovascularly intact. Patient states he feels well. He has primary care follow-up. Discharged to home with primary care follow-up.  Patient understands and agrees with the plan. He is stable for discharge.    Roxy Horsemanobert Zoejane Gaulin, PA-C 05/24/14 0020

## 2014-05-24 NOTE — Discharge Instructions (Signed)

## 2014-06-20 ENCOUNTER — Encounter (HOSPITAL_COMMUNITY): Payer: Self-pay | Admitting: Emergency Medicine

## 2014-06-20 ENCOUNTER — Emergency Department (HOSPITAL_COMMUNITY)
Admission: EM | Admit: 2014-06-20 | Discharge: 2014-06-20 | Disposition: A | Discharge status: Home / Self Care | Payer: Medicaid Other | Attending: Emergency Medicine | Admitting: Emergency Medicine

## 2014-06-20 DIAGNOSIS — I10 Essential (primary) hypertension: Secondary | ICD-10-CM | POA: Insufficient documentation

## 2014-06-20 DIAGNOSIS — Z720 Tobacco use: Secondary | ICD-10-CM | POA: Insufficient documentation

## 2014-06-20 DIAGNOSIS — J029 Acute pharyngitis, unspecified: Secondary | ICD-10-CM

## 2014-06-20 DIAGNOSIS — R51 Headache: Secondary | ICD-10-CM | POA: Insufficient documentation

## 2014-06-20 DIAGNOSIS — R519 Headache, unspecified: Secondary | ICD-10-CM

## 2014-06-20 MED ORDER — IBUPROFEN 600 MG PO TABS: 600.0000 mg | ORAL_TABLET | Freq: Four times a day (QID) | ORAL | Status: DC | PRN

## 2014-06-20 MED ORDER — AMOXICILLIN 500 MG PO CAPS: 500.0000 mg | ORAL_CAPSULE | Freq: Three times a day (TID) | ORAL | Status: DC

## 2014-06-20 NOTE — ED Provider Notes (Signed)
CSN: [id number]     Arrival date & time 06/20/14  1735 History   First MD Initiated Contact with Patient 06/20/14 1745     Chief Complaint  Patient presents with  . Sore Throat  . Headache     (Consider location/radiation/quality/duration/timing/severity/associated sxs/prior Treatment) Patient is a 28 y.o. male presenting with pharyngitis and headaches. The history is provided by the patient and medical records.  Sore Throat Associated symptoms include headaches and a sore throat.  Headache Associated symptoms: sore throat     This is a 28 year old male with past medical history significant for hypertension (not currently on meds), presenting to the ED for sore throat and headache for the past 2 days. Patient states his throat pain is worse with swallowing, but denies difficulty doing so.  Alsoassociated ear pain. He denies any cough, nasal congestion, fever, chills, or sweats. No neck pain.  No chest pain or shortness of breath. Patient states headache today is better than previous. He states it initially started on the right side of his head, but has since progressed to the left. He states earlier today he had a brief episode of paresthesias on his left arm and face, but this has since resolved. He denies any numbness or weakness of his extremities. He denies any dizziness, lightheadedness, tinnitus, changes in speech, or difficulty ambulating. No prior history of TIA or stroke, although this does run in his family.  No intervention for headache or sore throat tried PTA.  Past Medical History  Diagnosis Date  . Hypertension    Past Surgical History  Procedure Laterality Date  . Mouth surgery     History reviewed. No pertinent family history. History  Substance Use Topics  . Smoking status: Current Every Day Smoker -- 0.50 packs/day    Types: Cigarettes  . Smokeless tobacco: Never Used  . Alcohol Use: No     Comment: former    Review of Systems  HENT: Positive for sore throat.    Neurological: Positive for headaches.  All other systems reviewed and are negative.     Allergies  Review of patient's allergies indicates no known allergies.  Home Medications   Prior to Admission medications   Not on File   BP 137/69 mmHg  Pulse 88  Temp(Src) 98.2 F (36.8 C) (Oral)  Resp 13  SpO2 96%   Physical Exam  Constitutional: He is oriented to person, place, and time. He appears well-developed and well-nourished.  HENT:  Head: Normocephalic and atraumatic.  Right Ear: Ear canal normal. Tympanic membrane is injected and erythematous.  Left Ear: Tympanic membrane and ear canal normal.  Nose: Nose normal.  Mouth/Throat: Uvula is midline, oropharynx is clear and moist and mucous membranes are normal. No oropharyngeal exudate, posterior oropharyngeal edema, posterior oropharyngeal erythema or tonsillar abscesses.  Tonsils 1+ bilaterally without exudate; uvula midline without peritonsillar abscess; handling secretions appropriately; no difficulty swallowing or speaking Right OM; no mastoid tenderness or swelling  Eyes: Conjunctivae and EOM are normal. Pupils are equal, round, and reactive to light.  Neck: Normal range of motion and full passive range of motion without pain. Neck supple. No spinous process tenderness and no muscular tenderness present. No rigidity. No edema present.  No meningeal signs  Cardiovascular: Normal rate, regular rhythm and normal heart sounds.   Pulmonary/Chest: Effort normal and breath sounds normal. No respiratory distress. He has no wheezes.  Abdominal: Soft. Bowel sounds are normal. There is no tenderness. There is no guarding.  Musculoskeletal: Normal range  of motion.  Neurological: He is alert and oriented to person, place, and time. He has normal strength. He displays no tremor. No cranial nerve deficit or sensory deficit. He displays no seizure activity.  AAOx3, answering questions appropriately; equal strength UE and LE bilaterally; CN  grossly intact; moves all extremities appropriately without ataxia; no focal neuro deficits or facial asymmetry appreciated, normal gait  Skin: Skin is warm and dry.  Psychiatric: He has a normal mood and affect.  Nursing note and vitals reviewed.   ED Course  Procedures (including critical care time) Labs Review Labs Reviewed - No data to display  Imaging Review No results found.   EKG Interpretation None      MDM   Final diagnoses:  Sore throat  Headache, unspecified headache type   28 y.o. M with sore throat and headache x 2 days.  On exam, patient afebrile and nontoxic in appearance. His tonsils are 1+ bilaterally without exudate. Uvula remains midline without evidence of peritonsillar abscess. He is handling secretions well and has no difficulty speaking. Appears to have a right otitis media as well. Headache is without nuchal rigidity, fever, or neurologic deficit. Low suspicion for intracranial pathology at this time including TIA, stroke, SAH, ICH, or meningitis.  Will treat with amoxicillin for coverage of strep pharyngitis and otitis media.  Encouraged close FU with PCP.  Discussed plan with patient, he/she acknowledged understanding and agreed with plan of care.  Return precautions given for new or worsening symptoms.  Garlon HatchetLisa M Julion Gatt, PA-C 06/20/14 1944  Suzi RootsKevin E Steinl, MD 06/27/14 [phone]

## 2014-06-20 NOTE — ED Notes (Signed)
Pt presents to ED with c/o sore throat and headache since yesterday. Pt states he has had right sided headache and rnumbness and tinglings on left side of face and arm. Pt alerts and oriented x4 at this time, airway intact.

## 2014-06-20 NOTE — Discharge Instructions (Signed)
Take the prescribed medication as directed. °Follow-up with your primary care physician. °Return to the ED for new or worsening symptoms. ° °

## 2014-09-09 ENCOUNTER — Emergency Department (HOSPITAL_COMMUNITY): Payer: Medicaid Other

## 2014-09-09 ENCOUNTER — Encounter (HOSPITAL_COMMUNITY): Payer: Self-pay | Admitting: Emergency Medicine

## 2014-09-09 ENCOUNTER — Emergency Department (HOSPITAL_COMMUNITY)
Admission: EM | Admit: 2014-09-09 | Discharge: 2014-09-09 | Disposition: A | Discharge status: Home / Self Care | Payer: Medicaid Other | Attending: Emergency Medicine | Admitting: Emergency Medicine

## 2014-09-09 DIAGNOSIS — J40 Bronchitis, not specified as acute or chronic: Secondary | ICD-10-CM

## 2014-09-09 DIAGNOSIS — I10 Essential (primary) hypertension: Secondary | ICD-10-CM | POA: Insufficient documentation

## 2014-09-09 DIAGNOSIS — Z792 Long term (current) use of antibiotics: Secondary | ICD-10-CM | POA: Insufficient documentation

## 2014-09-09 DIAGNOSIS — Z720 Tobacco use: Secondary | ICD-10-CM | POA: Insufficient documentation

## 2014-09-09 MED ORDER — IPRATROPIUM BROMIDE 0.02 % IN SOLN
0.5000 mg | RESPIRATORY_TRACT | Status: AC
  Administered 2014-09-09: 0.5 mg via RESPIRATORY_TRACT
  Filled 2014-09-09: qty 2.5

## 2014-09-09 MED ORDER — ALBUTEROL SULFATE (2.5 MG/3ML) 0.083% IN NEBU
5.0000 mg | INHALATION_SOLUTION | Freq: Once | RESPIRATORY_TRACT | Status: AC
  Administered 2014-09-09: 5 mg via RESPIRATORY_TRACT
  Filled 2014-09-09: qty 6

## 2014-09-09 MED ORDER — NAPROXEN 500 MG PO TABS
500.0000 mg | ORAL_TABLET | Freq: Once | ORAL | Status: AC
  Administered 2014-09-09: 500 mg via ORAL
  Filled 2014-09-09: qty 1

## 2014-09-09 MED ORDER — ALBUTEROL SULFATE HFA 108 (90 BASE) MCG/ACT IN AERS: 2.0000 | INHALATION_SPRAY | RESPIRATORY_TRACT | Status: DC | PRN

## 2014-09-09 MED ORDER — PHENYLEPHRINE-DM-GG-APAP 5-10-200-325 MG PO TABS: 2.0000 | ORAL_TABLET | Freq: Three times a day (TID) | ORAL | Status: DC

## 2014-09-09 NOTE — ED Provider Notes (Signed)
CSN: [id number]     Arrival date & time 09/09/14  0417 History   First MD Initiated Contact with Patient 09/09/14 0446     Chief Complaint  Patient presents with  . Flu like symptoms    (Consider location/radiation/quality/duration/timing/severity/associated sxs/prior Treatment) HPI  Thomas Harrell is a 29 yo male presenting with report of cough and nasal congestion.  He states he began having tightness in his chest while coughing and became concerned.  He reports some chills but no documented fever   Past Medical History  Diagnosis Date  . Hypertension    Past Surgical History  Procedure Laterality Date  . Mouth surgery     History reviewed. No pertinent family history. History  Substance Use Topics  . Smoking status: Current Every Day Smoker -- 0.50 packs/day    Types: Cigarettes  . Smokeless tobacco: Never Used  . Alcohol Use: No     Comment: former    Review of Systems  Constitutional: Positive for chills.  HENT: Positive for congestion.   Respiratory: Positive for cough.       Allergies  Review of patient's allergies indicates no known allergies.  Home Medications   Prior to Admission medications   Medication Sig Start Date End Date Taking? Authorizing Provider  amoxicillin (AMOXIL) 500 MG capsule Take 1 capsule (500 mg total) by mouth 3 (three) times daily. Patient not taking: Reported on 09/09/2014 06/20/14   Garlon HatchetLisa M Sanders, PA-C  ibuprofen (ADVIL,MOTRIN) 600 MG tablet Take 1 tablet (600 mg total) by mouth every 6 (six) hours as needed. Patient not taking: Reported on 09/09/2014 06/20/14   Garlon HatchetLisa M Sanders, PA-C   BP 139/69 mmHg  Pulse 89  Temp(Src) 98.8 F (37.1 C) (Oral)  Resp 19  Ht 5\' 8"  (1.727 m)  Wt 190 lb (86.183 kg)  BMI 28.90 kg/m2  SpO2 96% Physical Exam  Constitutional: He appears well-developed and well-nourished. No distress.  HENT:  Head: Normocephalic and atraumatic.  Eyes: Conjunctivae are normal. Right eye exhibits no discharge. Left  eye exhibits no discharge. No scleral icterus.  Neck: Normal range of motion. Neck supple.  Cardiovascular: Intact distal pulses.   Pulmonary/Chest: Effort normal. No respiratory distress. He has wheezes ( mild). He has no rales. He exhibits no tenderness.  Neurological: He is alert. Coordination normal.  Skin: Skin is warm and dry. He is not diaphoretic.  Nursing note and vitals reviewed.   ED Course  Procedures (including critical care time) Labs Review Labs Reviewed - No data to display  Imaging Review No results found.   EKG Interpretation None      MDM   Final diagnoses:  Bronchitis   29 yo with symptoms consistent with URI and bronchitis.  His CXR is negative for acute infiltrate. Pt reports feeling better after neb tx and naproxen.  He will be discharged with symptomatic treatment.  Pt is well-appearing, in no acute distress and vital signs reviewed and not concerning. He appears safe to be discharged.  Discharge include follow-up with their PCP.  Return precautions provided.  He verbalizes understanding and is agreeable with plan.  Filed Vitals:   09/09/14 0419 09/09/14 0532 09/09/14 0555  BP: 139/69  145/80  Pulse: 89  96  Temp: 98.8 F (37.1 C)  98 F (36.7 C)  TempSrc: Oral  Oral  Resp: 19  16  Height: 5\' 8"  (1.727 m)    Weight: 190 lb (86.183 kg)    SpO2: 96% 95% 94%   Meds  given in ED:  Medications  albuterol (PROVENTIL) (2.5 MG/3ML) 0.083% nebulizer solution 5 mg (5 mg Nebulization Given 09/09/14 0532)  ipratropium (ATROVENT) nebulizer solution 0.5 mg (0.5 mg Nebulization Given 09/09/14 0532)  naproxen (NAPROSYN) tablet 500 mg (500 mg Oral Given 09/09/14 0550)    Discharge Medication List as of 09/09/2014  6:00 AM    START taking these medications   Details  albuterol (PROVENTIL HFA;VENTOLIN HFA) 108 (90 BASE) MCG/ACT inhaler Inhale 2 puffs into the lungs every 4 (four) hours as needed for wheezing or shortness of breath., Starting 09/09/2014, Until  Discontinued, Print    Phenylephrine-DM-GG-APAP 5-10-200-325 MG TABS Take 2 tablets by mouth 3 (three) times daily., Starting 09/09/2014, Until Discontinued, Print           Harle Battiest, NP 09/10/14 1251  Lyanne Co, MD 09/10/14 2306

## 2014-09-09 NOTE — ED Notes (Signed)
Bed: [id number] Expected date: 09/09/14 Expected time: 4:01 AM Means of arrival: Ambulance Comments: Flu like sx

## 2014-09-09 NOTE — ED Notes (Signed)
Per EMS pt has had runny nose,cough, lethargy. Pt is alert and oriented. 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

## 2014-09-09 NOTE — Discharge Instructions (Signed)
Please follow the directions provided. Be sure to follow-up with your primary care provider to ensure you are getting better.  Use the inhaler 2 puffs every 4 hours for shortness of breath.  Use the multi-symptoms cold medicine as directed for your other symptoms.  Don't hesitate to return for any new, worsening or concerning symptoms.      SEEK IMMEDIATE MEDICAL CARE IF:  You develop an increased fever or chills.  You have chest pain.  You have severe shortness of breath.  You have bloody sputum.  You develop dehydration.  You faint or repeatedly feel like you are going to pass out.  You develop repeated vomiting.  You develop a severe headache.

## 2014-10-26 ENCOUNTER — Encounter (HOSPITAL_COMMUNITY): Payer: Self-pay | Admitting: *Deleted

## 2014-10-26 ENCOUNTER — Emergency Department (HOSPITAL_COMMUNITY)
Admission: EM | Admit: 2014-10-26 | Discharge: 2014-10-27 | Disposition: A | Discharge status: Home / Self Care | Payer: Medicaid Other | Attending: Emergency Medicine | Admitting: Emergency Medicine

## 2014-10-26 DIAGNOSIS — I10 Essential (primary) hypertension: Secondary | ICD-10-CM | POA: Insufficient documentation

## 2014-10-26 DIAGNOSIS — Z79899 Other long term (current) drug therapy: Secondary | ICD-10-CM | POA: Insufficient documentation

## 2014-10-26 DIAGNOSIS — Z720 Tobacco use: Secondary | ICD-10-CM | POA: Insufficient documentation

## 2014-10-26 DIAGNOSIS — L219 Seborrheic dermatitis, unspecified: Secondary | ICD-10-CM | POA: Insufficient documentation

## 2014-10-26 NOTE — ED Notes (Signed)
Pt c/o head itchiness and dry skin x 2 weeks. Has not tried any medications for issue.

## 2014-10-27 NOTE — ED Provider Notes (Signed)
CSN: [id number]     Arrival date & time 10/26/14  2341 History   First MD Initiated Contact with Patient 10/26/14 2357     Chief Complaint  Patient presents with  . Pruritis     (Consider location/radiation/quality/duration/timing/severity/associated sxs/prior Treatment) HPI Comments: Patient presents today with a chief complaint of an itchy scaly scalp.  He reports that symptoms have been present for the past 2 weeks and are gradually worsening.  He has not tried any treatment prior to arrival.  He denies new shampoo or hair product.  Denies any rash anywhere else.  No fever, chills, nausea, or vomiting.  No swelling of the lips, tongue, or throat.   The history is provided by the patient.    Past Medical History  Diagnosis Date  . Hypertension    Past Surgical History  Procedure Laterality Date  . Mouth surgery     No family history on file. History  Substance Use Topics  . Smoking status: Current Every Day Smoker -- 0.50 packs/day    Types: Cigarettes  . Smokeless tobacco: Never Used  . Alcohol Use: No     Comment: former    Review of Systems  Constitutional: Negative for fever and chills.  Skin: Positive for rash.      Allergies  Review of patient's allergies indicates no known allergies.  Home Medications   Prior to Admission medications   Medication Sig Start Date End Date Taking? Authorizing Provider  albuterol (PROVENTIL HFA;VENTOLIN HFA) 108 (90 BASE) MCG/ACT inhaler Inhale 2 puffs into the lungs every 4 (four) hours as needed for wheezing or shortness of breath. 09/09/14   Harle BattiestElizabeth Tysinger, NP  amoxicillin (AMOXIL) 500 MG capsule Take 1 capsule (500 mg total) by mouth 3 (three) times daily. Patient not taking: Reported on 09/09/2014 06/20/14   Garlon HatchetLisa M Sanders, PA-C  ibuprofen (ADVIL,MOTRIN) 600 MG tablet Take 1 tablet (600 mg total) by mouth every 6 (six) hours as needed. Patient not taking: Reported on 09/09/2014 06/20/14   Garlon HatchetLisa M Sanders, PA-C   Phenylephrine-DM-GG-APAP 5-10-200-325 MG TABS Take 2 tablets by mouth 3 (three) times daily. 09/09/14   Harle BattiestElizabeth Tysinger, NP   BP 142/79 mmHg  Pulse 76  Temp(Src) 98 F (36.7 C) (Oral)  Resp 18  Ht 5\' 8"  (1.727 m)  Wt 190 lb (86.183 kg)  BMI 28.90 kg/m2  SpO2 98% Physical Exam  Constitutional: He appears well-developed and well-nourished.  HENT:  Head: Normocephalic and atraumatic.  Mouth/Throat: Oropharynx is clear and moist.  Scaling, crusting,  and mild erythema of the scalp.  No drainage.  Neck: Normal range of motion. Neck supple.  Cardiovascular: Normal rate, regular rhythm and normal heart sounds.   Pulmonary/Chest: Effort normal and breath sounds normal.  Musculoskeletal: Normal range of motion.  Neurological: He is alert.  Skin: Skin is warm and dry.  Psychiatric: He has a normal mood and affect.  Nursing note and vitals reviewed.   ED Course  Procedures (including critical care time) Labs Review Labs Reviewed - No data to display  Imaging Review No results found.   EKG Interpretation None      MDM   Final diagnoses:  Seborrheic dermatitis of scalp    Patient presents with an itchy scaly scalp.  Appearance most consistent with Seborrheic Dermatitis.  Patient instructed to use medicated shampoo.  Stable for discharge.  Return precautions given.      Santiago GladHeather Treveon Bourcier, PA-C 10/27/14 0127  Purvis SheffieldForrest Harrison, MD 10/27/14 [phone]

## 2014-10-27 NOTE — Discharge Instructions (Signed)
It is recommended that you try over the counter shampoos to treat seborrheic dermatitis such as [place], Salicylic Acid.  A pharmacist should be able to help you located these shampoos.  Use the shampoo 2-3 times a week for 2-3 weeks.

## 2014-11-11 ENCOUNTER — Emergency Department (HOSPITAL_COMMUNITY)
Admission: EM | Admit: 2014-11-11 | Discharge: 2014-11-11 | Disposition: A | Discharge status: Home / Self Care | Payer: Medicaid Other | Attending: Emergency Medicine | Admitting: Emergency Medicine

## 2014-11-11 ENCOUNTER — Encounter (HOSPITAL_COMMUNITY): Payer: Self-pay

## 2014-11-11 DIAGNOSIS — Z792 Long term (current) use of antibiotics: Secondary | ICD-10-CM | POA: Insufficient documentation

## 2014-11-11 DIAGNOSIS — Z720 Tobacco use: Secondary | ICD-10-CM | POA: Insufficient documentation

## 2014-11-11 DIAGNOSIS — S76312A Strain of muscle, fascia and tendon of the posterior muscle group at thigh level, left thigh, initial encounter: Secondary | ICD-10-CM | POA: Insufficient documentation

## 2014-11-11 DIAGNOSIS — X58XXXA Exposure to other specified factors, initial encounter: Secondary | ICD-10-CM | POA: Insufficient documentation

## 2014-11-11 DIAGNOSIS — Y9302 Activity, running: Secondary | ICD-10-CM | POA: Insufficient documentation

## 2014-11-11 DIAGNOSIS — I10 Essential (primary) hypertension: Secondary | ICD-10-CM | POA: Insufficient documentation

## 2014-11-11 DIAGNOSIS — Y998 Other external cause status: Secondary | ICD-10-CM | POA: Insufficient documentation

## 2014-11-11 DIAGNOSIS — Z79899 Other long term (current) drug therapy: Secondary | ICD-10-CM | POA: Insufficient documentation

## 2014-11-11 DIAGNOSIS — Y9289 Other specified places as the place of occurrence of the external cause: Secondary | ICD-10-CM | POA: Insufficient documentation

## 2014-11-11 MED ORDER — IBUPROFEN 800 MG PO TABS: 800.0000 mg | ORAL_TABLET | Freq: Three times a day (TID) | ORAL | Status: DC | PRN

## 2014-11-11 NOTE — ED Notes (Signed)
Pt presents with onset of L hamstring pain while racing (running) last night.

## 2014-11-11 NOTE — ED Provider Notes (Signed)
CSN: [id number]     Arrival date & time 11/11/14  1310 History   First MD Initiated Contact with Patient 11/11/14 1329     No chief complaint on file.  The history is provided by the patient. No language interpreter was used.    This chart was scribed for non-physician practitioner Santiago GladHeather Lawsen Arnott, PA-C, working with Blake DivineJohn Wofford, MD, by Andrew Auaven Small, ED Scribe. This patient was seen in room TR03C/TR03C and the patient's care was started at 1:36 PM.  Thomas Harrell is a 29 y.o. male who presents to the Emergency Department complaining of left hamstring leg injury. Pt reports while racing last night he felt a rip to left hamstring causing him to fall. Pt admits that he is not a frequent runner and that this race was out of the ordinary. Pt  has been able walk but has increased pain with bending his knee. He denies treating area with ice, heat, or medication. Pt denies numbness and tingling.   Past Medical History  Diagnosis Date  . Hypertension    Past Surgical History  Procedure Laterality Date  . Mouth surgery     History reviewed. No pertinent family history. History  Substance Use Topics  . Smoking status: Current Every Day Smoker -- 0.50 packs/day    Types: Cigarettes  . Smokeless tobacco: Never Used  . Alcohol Use: No     Comment: former    Review of Systems  Musculoskeletal: Positive for myalgias and gait problem.  Neurological: Negative for weakness and numbness.   Allergies  Review of patient's allergies indicates no known allergies.  Home Medications   Prior to Admission medications   Medication Sig Start Date End Date Taking? Authorizing Provider  albuterol (PROVENTIL HFA;VENTOLIN HFA) 108 (90 BASE) MCG/ACT inhaler Inhale 2 puffs into the lungs every 4 (four) hours as needed for wheezing or shortness of breath. 09/09/14   Harle BattiestElizabeth Tysinger, NP  amoxicillin (AMOXIL) 500 MG capsule Take 1 capsule (500 mg total) by mouth 3 (three) times daily. Patient not taking:  Reported on 09/09/2014 06/20/14   Garlon HatchetLisa M Sanders, PA-C  ibuprofen (ADVIL,MOTRIN) 600 MG tablet Take 1 tablet (600 mg total) by mouth every 6 (six) hours as needed. Patient not taking: Reported on 09/09/2014 06/20/14   Garlon HatchetLisa M Sanders, PA-C  Phenylephrine-DM-GG-APAP 5-10-200-325 MG TABS Take 2 tablets by mouth 3 (three) times daily. 09/09/14   Harle BattiestElizabeth Tysinger, NP   BP 135/75 mmHg  Pulse 91  Temp(Src) 98.9 F (37.2 C) (Oral)  Resp 18  Ht 5\' 8"  (1.727 m)  Wt 190 lb (86.183 kg)  BMI 28.90 kg/m2  SpO2 97% Physical Exam  Constitutional: He is oriented to person, place, and time. He appears well-developed and well-nourished. No distress.  HENT:  Head: Normocephalic and atraumatic.  Eyes: Conjunctivae and EOM are normal.  Neck: Neck supple.  Cardiovascular: Normal rate, regular rhythm and normal heart sounds.   Pulses:      Dorsalis pedis pulses are 2+ on the right side, and 2+ on the left side.  Pulmonary/Chest: Effort normal and breath sounds normal.  Musculoskeletal: Normal range of motion.       Left hip: He exhibits normal range of motion, no bony tenderness and no swelling.       Left knee: He exhibits normal range of motion, no swelling, no effusion and no bony tenderness. No tenderness found.  Tenderness to palpation to left hamstring. No erythema, warmth or obvious edema. Full flexion and extension of left knee.  No bony tenderness of left knee. increased pain of the hamstring with flexion of left hip but full flexion, extension, abduction  and adduction of left hip. No bony tenderness of the left hip  Neurological: He is alert and oriented to person, place, and time.  Distal sensation of left foot intact  Skin: Skin is warm and dry.  Psychiatric: He has a normal mood and affect. His behavior is normal.  Nursing note and vitals reviewed.   ED Course  Procedures (including critical care time) DIAGNOSTIC STUDIES: Oxygen Saturation is 97% on RA, normal by my interpretation.     COORDINATION OF CARE: 1:49 PM- Pt advised of plan for treatment and pt agrees.  Labs Review Labs Reviewed - No data to display  Imaging Review No results found.   EKG Interpretation None      MDM   Final diagnoses:  None   Patient presents today with pain of his left hamstring, which has been present since running last evening.  He reports that he does not routinely run.Marland Kitchen  He has been ambulatory since the injury.  Full ROM of the left knee and left hip, but increased pain with flexion of the hip.  No bony tenderness of the hip or knee.  No obvious erythema or edema on exam.  Suspect muscle strain.  RICE instructions given.  Patient stable for discharge.  Return precautions given.     Santiago Glad, PA-C 11/11/14 1516  Blake Divine, MD 11/11/14 [phone]

## 2014-12-09 ENCOUNTER — Emergency Department (HOSPITAL_COMMUNITY)
Admission: EM | Admit: 2014-12-09 | Discharge: 2014-12-09 | Disposition: A | Discharge status: Home / Self Care | Payer: Medicaid Other | Attending: Emergency Medicine | Admitting: Emergency Medicine

## 2014-12-09 ENCOUNTER — Encounter (HOSPITAL_COMMUNITY): Payer: Self-pay | Admitting: *Deleted

## 2014-12-09 ENCOUNTER — Emergency Department (HOSPITAL_COMMUNITY): Payer: Medicaid Other

## 2014-12-09 DIAGNOSIS — I10 Essential (primary) hypertension: Secondary | ICD-10-CM | POA: Insufficient documentation

## 2014-12-09 DIAGNOSIS — Z79899 Other long term (current) drug therapy: Secondary | ICD-10-CM | POA: Insufficient documentation

## 2014-12-09 DIAGNOSIS — Y929 Unspecified place or not applicable: Secondary | ICD-10-CM | POA: Insufficient documentation

## 2014-12-09 DIAGNOSIS — Y939 Activity, unspecified: Secondary | ICD-10-CM | POA: Insufficient documentation

## 2014-12-09 DIAGNOSIS — S6991XA Unspecified injury of right wrist, hand and finger(s), initial encounter: Secondary | ICD-10-CM | POA: Insufficient documentation

## 2014-12-09 DIAGNOSIS — M25531 Pain in right wrist: Secondary | ICD-10-CM

## 2014-12-09 DIAGNOSIS — Z720 Tobacco use: Secondary | ICD-10-CM | POA: Insufficient documentation

## 2014-12-09 DIAGNOSIS — W1839XA Other fall on same level, initial encounter: Secondary | ICD-10-CM | POA: Insufficient documentation

## 2014-12-09 DIAGNOSIS — Y999 Unspecified external cause status: Secondary | ICD-10-CM | POA: Insufficient documentation

## 2014-12-09 MED ORDER — NAPROXEN 500 MG PO TABS: 500.0000 mg | ORAL_TABLET | Freq: Two times a day (BID) | ORAL | Status: DC

## 2014-12-09 MED ORDER — HYDROCODONE-ACETAMINOPHEN 5-325 MG PO TABS
1.0000 | ORAL_TABLET | Freq: Once | ORAL | Status: AC
  Administered 2014-12-09: 1 via ORAL
  Filled 2014-12-09: qty 1

## 2014-12-09 NOTE — ED Provider Notes (Signed)
CSN: [id number]     Arrival date & time 12/09/14  0013 History   First MD Initiated Contact with Patient 12/09/14 0049     Chief Complaint  Patient presents with  . Wrist Pain     (Consider location/radiation/quality/duration/timing/severity/associated sxs/prior Treatment) Patient is a 29 y.o. male presenting with wrist pain. The history is provided by the patient. No language interpreter was used.  Wrist Pain This is a new problem. The current episode started yesterday. The problem has been gradually worsening. Associated symptoms include arthralgias. He has tried NSAIDs for the symptoms. The treatment provided no relief.    Past Medical History  Diagnosis Date  . Hypertension    Past Surgical History  Procedure Laterality Date  . Mouth surgery     No family history on file. History  Substance Use Topics  . Smoking status: Current Every Day Smoker -- 0.50 packs/day    Types: Cigarettes  . Smokeless tobacco: Never Used  . Alcohol Use: No     Comment: former    Review of Systems  Musculoskeletal: Positive for arthralgias.  All other systems reviewed and are negative.     Allergies  Review of patient's allergies indicates no known allergies.  Home Medications   Prior to Admission medications   Medication Sig Start Date End Date Taking? Authorizing Provider  albuterol (PROVENTIL HFA;VENTOLIN HFA) 108 (90 BASE) MCG/ACT inhaler Inhale 2 puffs into the lungs every 4 (four) hours as needed for wheezing or shortness of breath. 09/09/14   Harle BattiestElizabeth Tysinger, NP  amoxicillin (AMOXIL) 500 MG capsule Take 1 capsule (500 mg total) by mouth 3 (three) times daily. Patient not taking: Reported on 09/09/2014 06/20/14   Garlon HatchetLisa M Sanders, PA-C  ibuprofen (ADVIL,MOTRIN) 800 MG tablet Take 1 tablet (800 mg total) by mouth every 8 (eight) hours as needed. 11/11/14   Heather Laisure, PA-C  Phenylephrine-DM-GG-APAP 5-10-200-325 MG TABS Take 2 tablets by mouth 3 (three) times daily. 09/09/14    Harle BattiestElizabeth Tysinger, NP   BP 136/69 mmHg  Pulse 68  Temp(Src) 97.9 F (36.6 C) (Oral)  Resp 22  Ht 5\' 7"  (1.702 m)  Wt 192 lb (87.091 kg)  BMI 30.06 kg/m2  SpO2 99% Physical Exam  Constitutional: He is oriented to person, place, and time. He appears well-developed and well-nourished.  HENT:  Head: Normocephalic.  Eyes: Conjunctivae are normal.  Neck: Neck supple.  Cardiovascular: Normal rate and regular rhythm.   Pulmonary/Chest: Effort normal and breath sounds normal.  Abdominal: Soft.  Musculoskeletal: He exhibits tenderness. He exhibits no edema.       Right wrist: He exhibits tenderness. He exhibits normal range of motion, no crepitus and no deformity.  Lymphadenopathy:    He has no cervical adenopathy.  Neurological: He is alert and oriented to person, place, and time.  Skin: Skin is warm and dry.  Psychiatric: He has a normal mood and affect.  Nursing note and vitals reviewed.   ED Course  Procedures (including critical care time) Labs Review Labs Reviewed - No data to display  Imaging Review Dg Wrist Complete Right  12/09/2014   CLINICAL DATA:  Pain after fall last night.  EXAM: RIGHT WRIST - COMPLETE 3+ VIEW  COMPARISON:  02/14/14  FINDINGS: There is no evidence of fracture or dislocation. There is no evidence of arthropathy or other focal bone abnormality. Soft tissues are unremarkable.  IMPRESSION: Negative.   Electronically Signed   By: Ellery Plunkaniel R Mitchell M.D.   On: 12/09/2014 00:47  EKG Interpretation None     Radiology results reviewed and shared with patient. MDM   Final diagnoses:  None    Right wrist sprain. Wrist splint. Anti-inflammatory. Ortho follow-up if no improvement. Return precautions discussed.    Felicie Mornavid Laylia Mui, NP 12/09/14 0151  Loren Raceravid Yelverton, MD 12/09/14 [phone]

## 2014-12-09 NOTE — Discharge Instructions (Signed)
Wrist Pain °A wrist sprain happens when the bands of tissue that hold the wrist joints together (ligament) stretch too much or tear. A wrist strain happens when muscles or bands of tissue that connect muscles to bones (tendons) are stretched or pulled. °HOME CARE °· Put ice on the injured area. °¨ Put ice in a plastic bag. °¨ Place a towel between your skin and the bag. °¨ Leave the ice on for 15-20 minutes, 03-04 times a day, for the first 2 days. °· Raise (elevate) the injured wrist to lessen puffiness (swelling). °· Rest the injured wrist for at least 48 hours or as told by your doctor. °· Wear a splint, cast, or an elastic wrap as told by your doctor. °· Only take medicine as told by your doctor. °· Follow up with your doctor as told. This is important. °GET HELP RIGHT AWAY IF:  °· The fingers are puffy, very red, white, or cold and blue. °· The fingers lose feeling (numb) or tingle. °· The pain gets worse. °· It is hard to move the fingers. °MAKE SURE YOU:  °· Understand these instructions. °· Will watch your condition. °· Will get help right away if you are not doing well or get worse. °Document Released: 01/01/2008 Document Revised: 10/07/2011 Document Reviewed: 09/05/2010 °[employer]® Patient Information ©2015 [employer]. This information is not intended to replace advice given to you by your health care provider. Make sure you discuss any questions you have with your health care provider. ° °

## 2014-12-09 NOTE — ED Notes (Signed)
Pt states that he fell on his right wrist last night and has been experiencing pain since. States he took ibuprofen twice with no relief. States he can't sleep because of the ache.

## 2014-12-17 ENCOUNTER — Emergency Department (HOSPITAL_COMMUNITY): Payer: Medicaid Other

## 2014-12-17 ENCOUNTER — Emergency Department (HOSPITAL_COMMUNITY)
Admission: EM | Admit: 2014-12-17 | Discharge: 2014-12-17 | Disposition: A | Discharge status: Home / Self Care | Payer: Medicaid Other | Attending: Emergency Medicine | Admitting: Emergency Medicine

## 2014-12-17 ENCOUNTER — Encounter (HOSPITAL_COMMUNITY): Payer: Self-pay | Admitting: Emergency Medicine

## 2014-12-17 DIAGNOSIS — Y998 Other external cause status: Secondary | ICD-10-CM | POA: Insufficient documentation

## 2014-12-17 DIAGNOSIS — Z720 Tobacco use: Secondary | ICD-10-CM | POA: Insufficient documentation

## 2014-12-17 DIAGNOSIS — I10 Essential (primary) hypertension: Secondary | ICD-10-CM | POA: Insufficient documentation

## 2014-12-17 DIAGNOSIS — Z79899 Other long term (current) drug therapy: Secondary | ICD-10-CM | POA: Insufficient documentation

## 2014-12-17 DIAGNOSIS — Y9383 Activity, rough housing and horseplay: Secondary | ICD-10-CM | POA: Insufficient documentation

## 2014-12-17 DIAGNOSIS — S20212A Contusion of left front wall of thorax, initial encounter: Secondary | ICD-10-CM | POA: Insufficient documentation

## 2014-12-17 DIAGNOSIS — Y929 Unspecified place or not applicable: Secondary | ICD-10-CM | POA: Insufficient documentation

## 2014-12-17 DIAGNOSIS — W228XXA Striking against or struck by other objects, initial encounter: Secondary | ICD-10-CM | POA: Insufficient documentation

## 2014-12-17 MED ORDER — IBUPROFEN 600 MG PO TABS: 600.0000 mg | ORAL_TABLET | Freq: Four times a day (QID) | ORAL | Status: DC | PRN

## 2014-12-17 MED ORDER — OXYCODONE-ACETAMINOPHEN 5-325 MG PO TABS
1.0000 | ORAL_TABLET | Freq: Four times a day (QID) | ORAL | Status: DC | PRN
Start: 2014-12-17 — End: 2015-02-13

## 2014-12-17 MED ORDER — OXYCODONE-ACETAMINOPHEN 5-325 MG PO TABS
1.0000 | ORAL_TABLET | Freq: Once | ORAL | Status: AC
  Administered 2014-12-17: 1 via ORAL
  Filled 2014-12-17: qty 1

## 2014-12-17 MED ORDER — KETOROLAC TROMETHAMINE 60 MG/2ML IM SOLN
60.0000 mg | Freq: Once | INTRAMUSCULAR | Status: AC
  Administered 2014-12-17: 60 mg via INTRAMUSCULAR
  Filled 2014-12-17: qty 2

## 2014-12-17 NOTE — ED Notes (Signed)
Pt to xray
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
## 2014-12-17 NOTE — Discharge Instructions (Signed)
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
## 2015-02-13 ENCOUNTER — Encounter (HOSPITAL_COMMUNITY): Payer: Self-pay | Admitting: *Deleted

## 2015-02-13 ENCOUNTER — Emergency Department (HOSPITAL_COMMUNITY)
Admission: EM | Admit: 2015-02-13 | Discharge: 2015-02-13 | Disposition: A | Discharge status: Home / Self Care | Payer: Medicaid Other | Attending: Emergency Medicine | Admitting: Emergency Medicine

## 2015-02-13 DIAGNOSIS — Z79899 Other long term (current) drug therapy: Secondary | ICD-10-CM | POA: Insufficient documentation

## 2015-02-13 DIAGNOSIS — G43809 Other migraine, not intractable, without status migrainosus: Secondary | ICD-10-CM

## 2015-02-13 DIAGNOSIS — Z720 Tobacco use: Secondary | ICD-10-CM | POA: Insufficient documentation

## 2015-02-13 DIAGNOSIS — I10 Essential (primary) hypertension: Secondary | ICD-10-CM | POA: Insufficient documentation

## 2015-02-13 DIAGNOSIS — R200 Anesthesia of skin: Secondary | ICD-10-CM | POA: Insufficient documentation

## 2015-02-13 MED ORDER — METOCLOPRAMIDE HCL 5 MG/ML IJ SOLN
10.0000 mg | Freq: Once | INTRAMUSCULAR | Status: AC
  Administered 2015-02-13: 10 mg via INTRAVENOUS
  Filled 2015-02-13: qty 2

## 2015-02-13 MED ORDER — DIPHENHYDRAMINE HCL 50 MG/ML IJ SOLN
25.0000 mg | Freq: Once | INTRAMUSCULAR | Status: AC
  Administered 2015-02-13: 25 mg via INTRAVENOUS
  Filled 2015-02-13: qty 1

## 2015-02-13 NOTE — ED Notes (Signed)
No neuro deficits noted in triage. Pt states that he has had headaches like this in the past with numbness/tingling in his face and left arm. PA josh at bedside.

## 2015-02-13 NOTE — ED Provider Notes (Signed)
CSN: [id number]     Arrival date & time 02/13/15  1153 History   First MD Initiated Contact with Patient 02/13/15 1216     Chief Complaint  Patient presents with  . Headache     (Consider location/radiation/quality/duration/timing/severity/associated sxs/prior Treatment) HPI Comments: Patient with history of intermittent headaches presents with complaint of acute headache with aura described as spots in his vision. Patient developed a right sided throbbing headache shortly afterwards. In addition, he developed a tingling sensation in his left face and left arm without weakness. Patient has never been diagnosed with migraine headaches and has never seen anyone for his headaches, but he has been getting them since he was a teenager. He has had photophobia and nausea but no vomiting. Patient denies head injury, fever, neck pain.   The history is provided by the patient.    Past Medical History  Diagnosis Date  . Hypertension    Past Surgical History  Procedure Laterality Date  . Mouth surgery     No family history on file. History  Substance Use Topics  . Smoking status: Current Every Day Smoker -- 0.50 packs/day    Types: Cigarettes  . Smokeless tobacco: Never Used  . Alcohol Use: No     Comment: former    Review of Systems  Constitutional: Negative for fever.  HENT: Negative for congestion, dental problem, rhinorrhea and sinus pressure.   Eyes: Positive for photophobia and visual disturbance. Negative for discharge and redness.  Respiratory: Negative for shortness of breath.   Cardiovascular: Negative for chest pain.  Gastrointestinal: Negative for nausea and vomiting.  Musculoskeletal: Negative for gait problem, neck pain and neck stiffness.  Skin: Negative for rash.  Neurological: Positive for numbness (Tingling in left face and left arm) and headaches. Negative for syncope, speech difficulty, weakness and light-headedness.  Psychiatric/Behavioral: Negative for confusion.       Allergies  Review of patient's allergies indicates no known allergies.  Home Medications   Prior to Admission medications   Medication Sig Start Date End Date Taking? Authorizing Provider  albuterol (PROVENTIL HFA;VENTOLIN HFA) 108 (90 BASE) MCG/ACT inhaler Inhale 2 puffs into the lungs every 4 (four) hours as needed for wheezing or shortness of breath. Patient not taking: Reported on 12/17/2014 09/09/14   Harle Battiest, NP  amoxicillin (AMOXIL) 500 MG capsule Take 1 capsule (500 mg total) by mouth 3 (three) times daily. Patient not taking: Reported on 09/09/2014 06/20/14   Garlon Hatchet, PA-C  ibuprofen (ADVIL,MOTRIN) 600 MG tablet Take 1 tablet (600 mg total) by mouth every 6 (six) hours as needed. 12/17/14   [name], MD  naproxen (NAPROSYN) 500 MG tablet Take 1 tablet (500 mg total) by mouth 2 (two) times daily. Patient not taking: Reported on 12/17/2014 12/09/14   Felicie Morn, NP  oxyCODONE-acetaminophen (PERCOCET/ROXICET) 5-325 MG per tablet Take 1 tablet by mouth every 6 (six) hours as needed for severe pain. 12/17/14   [name], MD  Phenylephrine-DM-GG-APAP 5-10-200-325 MG TABS Take 2 tablets by mouth 3 (three) times daily. Patient not taking: Reported on 12/17/2014 09/09/14   Harle Battiest, NP   BP 143/75 mmHg  Pulse 73  Temp(Src) 98.8 F (37.1 C) (Oral)  Resp 18  Ht  (1.727 m)  Wt 192 lb (87.091 kg)  BMI 29.20 kg/m2  SpO2 97%   Physical Exam  Constitutional: He is oriented to person, place, and time. He appears well-developed and well-nourished.  HENT:  Head: Normocephalic and atraumatic.  Right  Ear: Tympanic membrane, external ear and ear canal normal.  Left Ear: Tympanic membrane, external ear and ear canal normal.  Nose: Nose normal.  Mouth/Throat: Uvula is midline, oropharynx is clear and moist and mucous membranes are normal.  Eyes: Conjunctivae, EOM and lids are normal. Pupils are equal, round, and reactive to light.  Neck:  Normal range of motion. Neck supple.  Cardiovascular: Normal rate and regular rhythm.   Pulmonary/Chest: Effort normal and breath sounds normal.  Abdominal: Soft. There is no tenderness.  Musculoskeletal: Normal range of motion.       Cervical back: He exhibits normal range of motion, no tenderness and no bony tenderness.  Neurological: He is alert and oriented to person, place, and time. He has normal strength and normal reflexes. No cranial nerve deficit or sensory deficit. He exhibits normal muscle tone. He displays a negative Romberg sign. Coordination and gait normal. GCS eye subscore is 4. GCS verbal subscore is 5. GCS motor subscore is 6.  Skin: Skin is warm and dry.  Psychiatric: He has a normal mood and affect.  Nursing note and vitals reviewed.   ED Course  Procedures (including critical care time) Labs Review Labs Reviewed - No data to display  Imaging Review No results found.   EKG Interpretation None       12:23 PM Patient seen and examined. Work-up initiated. Medications ordered.   Vital signs reviewed and are as follows: BP 143/75 mmHg  Pulse 73  Temp(Src) 98.8 F (37.1 C) (Oral)  Resp 18  Ht 5\' 8"  (1.727 m)  Wt 192 lb (87.091 kg)  BMI 29.20 kg/m2  SpO2 97%  1:39 PM patient states that he is ready for discharge to home.  Patient counseled to return if they have weakness in their arms or legs, slurred speech, trouble walking or talking, confusion, trouble with their balance, or if they have any other concerns. Patient verbalizes understanding and agrees with plan.    MDM   Final diagnoses:  Other migraine without status migrainosus, not intractable   Patient with headache with migranious features, likely undiagnosed migraine HA. Patient without high-risk features of headache including: sudden onset/thunderclap HA, no similar headache in past, altered mental status, accompanying seizure, headache with exertion, age > 350, history of immunocompromise, neck  or shoulder pain, fever, use of anticoagulation, family history of spontaneous SAH, concomitant drug use, toxic exposure.   Patient has a normal complete neurological exam, normal vital signs, normal level of consciousness, no signs of meningismus, is well-appearing/non-toxic appearing, no signs of trauma.   Imaging with CT/MRI not indicated given history and physical exam findings.   No dangerous or life-threatening conditions suspected or identified by history, physical exam, and by work-up. No indications for hospitalization identified.      Renne CriglerJoshua Adebayo Ensminger, PA-C 02/13/15 1341  Mancel BaleElliott Wentz, MD 02/13/15 [phone]

## 2015-02-13 NOTE — ED Notes (Signed)
Pt reports rt sided headache that started today. Pt reports some sensitivity to light.

## 2015-02-13 NOTE — Discharge Instructions (Signed)
Please read and follow all provided instructions.  Your diagnoses today include:  1. Other migraine without status migrainosus, not intractable     Tests performed today include:  Vital signs. See below for your results today.   Medications:  In the Emergency Department you received:  Reglan - antinausea/headache medication  Benadryl - antihistamine to counteract potential side effects of reglan  Take any prescribed medications only as directed.  Additional information:  Follow any educational materials contained in this packet.  You are having a headache. No specific cause was found today for your headache. It may have been a migraine or other cause of headache. Stress, anxiety, fatigue, and depression are common triggers for headaches.   Your headache today does not appear to be life-threatening or require hospitalization, but often the exact cause of headaches is not determined in the emergency department. Therefore, follow-up with your doctor is very important to find out what may have caused your headache and whether or not you need any further diagnostic testing or treatment.   Sometimes headaches can appear benign (not harmful), but then more serious symptoms can develop which should prompt an immediate re-evaluation by your doctor or the emergency department.  BE VERY CAREFUL not to take multiple medicines containing Tylenol (also called acetaminophen). Doing so can lead to an overdose which can damage your liver and cause liver failure and possibly death.   Follow-up instructions: Please follow-up with your primary care provider in the next 3 days for further evaluation of your symptoms.   Return instructions:   Please return to the Emergency Department if you experience worsening symptoms.  Return if the medications do not resolve your headache, if it recurs, or if you have multiple episodes of vomiting or cannot keep down fluids.  Return if you have a change from the  usual headache.  RETURN IMMEDIATELY IF you:  Develop a sudden, severe headache  Develop confusion or become poorly responsive or faint  Develop a fever above 100.35F or problem breathing  Have a change in speech, vision, swallowing, or understanding  Develop new weakness, numbness, tingling, incoordination in your arms or legs  Have a seizure  Please return if you have any other emergent concerns.  Additional Information:  Your vital signs today were: BP 118/61 mmHg   Pulse 86   Temp(Src) 98.8 F (37.1 C) (Oral)   Resp 15   Ht  (1.727 m)   Wt 192 lb (87.091 kg)   BMI 29.20 kg/m2   SpO2 99% If your blood pressure (BP) was elevated above 135/85 this visit, please have this repeated by your doctor within one month. --------------

## 2015-03-20 ENCOUNTER — Encounter (HOSPITAL_COMMUNITY): Payer: Self-pay | Admitting: *Deleted

## 2015-03-20 ENCOUNTER — Emergency Department (HOSPITAL_COMMUNITY): Payer: Medicaid Other

## 2015-03-20 ENCOUNTER — Emergency Department (HOSPITAL_COMMUNITY)
Admission: EM | Admit: 2015-03-20 | Discharge: 2015-03-20 | Disposition: A | Discharge status: Home / Self Care | Payer: Medicaid Other | Attending: Emergency Medicine | Admitting: Emergency Medicine

## 2015-03-20 DIAGNOSIS — J45901 Unspecified asthma with (acute) exacerbation: Secondary | ICD-10-CM

## 2015-03-20 DIAGNOSIS — I10 Essential (primary) hypertension: Secondary | ICD-10-CM | POA: Insufficient documentation

## 2015-03-20 DIAGNOSIS — Z720 Tobacco use: Secondary | ICD-10-CM | POA: Insufficient documentation

## 2015-03-20 HISTORY — DX: Unspecified asthma, uncomplicated: J45.909

## 2015-03-20 LAB — BASIC METABOLIC PANEL
Anion gap: 7 (ref 5–15)
BUN: 14 mg/dL (ref 6–20)
CHLORIDE: 109 mmol/L (ref 101–111)
CO2: 22 mmol/L (ref 22–32)
CREATININE: 1.3 mg/dL — AB (ref 0.61–1.24)
Calcium: 9.4 mg/dL (ref 8.9–10.3)
GFR calc Af Amer: >60 mL/min (ref >60)
GFR calc non Af Amer: >60 mL/min (ref >60)
GLUCOSE: 96 mg/dL (ref 65–99)
Potassium: 3.8 mmol/L (ref 3.5–5.1)
Sodium: 138 mmol/L (ref 135–145)

## 2015-03-20 LAB — CBC
HCT: 47.4 % (ref 39.0–52.0)
Hemoglobin: 15.4 g/dL (ref 13.0–17.0)
MCH: 27.8 pg (ref 26.0–34.0)
MCHC: 32.5 g/dL (ref 30.0–36.0)
MCV: 85.7 fL (ref 78.0–100.0)
PLATELETS: 297 10*3/uL (ref 150–400)
RBC: 5.53 MIL/uL (ref 4.22–5.81)
RDW: 13.2 % (ref 11.5–15.5)
WBC: 11 10*3/uL — ABNORMAL HIGH (ref 4.0–10.5)

## 2015-03-20 LAB — I-STAT TROPONIN, ED: Troponin i, poc: 0 ng/mL (ref 0.00–0.08)

## 2015-03-20 MED ORDER — ALBUTEROL SULFATE HFA 108 (90 BASE) MCG/ACT IN AERS: 1.0000 | INHALATION_SPRAY | Freq: Four times a day (QID) | RESPIRATORY_TRACT | Status: DC | PRN

## 2015-03-20 MED ORDER — ACETAMINOPHEN 325 MG PO TABS
325.0000 mg | ORAL_TABLET | Freq: Once | ORAL | Status: AC
  Administered 2015-03-20: 325 mg via ORAL
  Filled 2015-03-20: qty 1

## 2015-03-20 MED ORDER — PREDNISONE 10 MG (21) PO TBPK: 10.0000 mg | ORAL_TABLET | Freq: Every day | ORAL | Status: DC

## 2015-03-20 MED ORDER — ALBUTEROL SULFATE (2.5 MG/3ML) 0.083% IN NEBU
5.0000 mg | INHALATION_SOLUTION | Freq: Once | RESPIRATORY_TRACT | Status: AC
  Administered 2015-03-20: 5 mg via RESPIRATORY_TRACT
  Filled 2015-03-20: qty 6

## 2015-03-20 MED ORDER — PREDNISONE 20 MG PO TABS
60.0000 mg | ORAL_TABLET | Freq: Once | ORAL | Status: AC
  Administered 2015-03-20: 60 mg via ORAL
  Filled 2015-03-20: qty 3

## 2015-03-20 MED ORDER — IPRATROPIUM BROMIDE 0.02 % IN SOLN
0.5000 mg | Freq: Once | RESPIRATORY_TRACT | Status: AC
  Administered 2015-03-20: 0.5 mg via RESPIRATORY_TRACT
  Filled 2015-03-20: qty 2.5

## 2015-03-20 NOTE — ED Notes (Signed)
MD at bedside. 
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
## 2015-03-20 NOTE — ED Notes (Signed)
Pt ambulated in the hallway with ease. Heart rate 95 o2 99

## 2015-03-20 NOTE — Discharge Instructions (Signed)
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
## 2015-04-24 ENCOUNTER — Emergency Department (HOSPITAL_COMMUNITY)
Admission: EM | Admit: 2015-04-24 | Discharge: 2015-04-24 | Disposition: A | Discharge status: Home / Self Care | Payer: Medicaid Other | Attending: Emergency Medicine | Admitting: Emergency Medicine

## 2015-04-24 ENCOUNTER — Encounter (HOSPITAL_COMMUNITY): Payer: Self-pay | Admitting: Emergency Medicine

## 2015-04-24 DIAGNOSIS — Z79899 Other long term (current) drug therapy: Secondary | ICD-10-CM | POA: Insufficient documentation

## 2015-04-24 DIAGNOSIS — M25531 Pain in right wrist: Secondary | ICD-10-CM

## 2015-04-24 DIAGNOSIS — Z720 Tobacco use: Secondary | ICD-10-CM | POA: Insufficient documentation

## 2015-04-24 DIAGNOSIS — J45909 Unspecified asthma, uncomplicated: Secondary | ICD-10-CM | POA: Insufficient documentation

## 2015-04-24 DIAGNOSIS — I10 Essential (primary) hypertension: Secondary | ICD-10-CM | POA: Insufficient documentation

## 2015-04-24 HISTORY — DX: Unspecified sprain of unspecified wrist, initial encounter: S63.509A

## 2015-04-24 NOTE — ED Notes (Signed)
Pt reports pain in r/wrist. Pt is requesting a replacement velcro splint

## 2015-04-24 NOTE — ED Provider Notes (Signed)
CSN: [id number]     Arrival date & time 04/24/15  1007 History   First MD Initiated Contact with Patient 04/24/15 1033     Chief Complaint  Patient presents with  . Wrist Pain    recurrent wrist pain     (Consider location/radiation/quality/duration/timing/severity/associated sxs/prior Treatment) HPI Thomas Harrell is a 29 y.o. male who comes in for evaluation of right wrist pain. 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 He reports having a wrist splint that he used while doing this activity, but lost it and is requesting a replacement. He has been taking Tylenol with some relief. Current discomfort is mild. Denies fevers, chills, numbness or weakness, overt injury or trauma. No other aggravating or modifying factors.  Past Medical History  Diagnosis Date  . Hypertension   . Asthma   . Wrist sprain    Past Surgical History  Procedure Laterality Date  . Mouth surgery     No family history on file. Social History  Substance Use Topics  . Smoking status: Current Every Day Smoker -- 0.50 packs/day    Types: Cigarettes  . Smokeless tobacco: Never Used  . Alcohol Use: No     Comment: former    Review of Systems A 10 point review of systems was completed and was negative except for pertinent positives and negatives as mentioned in the history of present illness     Allergies  Review of patient's allergies indicates no known allergies.  Home Medications   Prior to Admission medications   Medication Sig Start Date End Date Taking? Authorizing Provider  albuterol (PROVENTIL HFA;VENTOLIN HFA) 108 (90 BASE) MCG/ACT inhaler Inhale 2 puffs into the lungs every 4 (four) hours as needed for wheezing or shortness of breath. Patient not taking: Reported on 12/17/2014 09/09/14   Harle Battiest, NP  albuterol (PROVENTIL HFA;VENTOLIN HFA) 108 (90 BASE) MCG/ACT inhaler Inhale 1-2 puffs into the lungs every 6  (six) hours as needed for wheezing or shortness of breath. 03/20/15   Samantha Tripp Dowless, PA-C  predniSONE (STERAPRED UNI-PAK 21 TAB) 10 MG (21) TBPK tablet Take 1 tablet (10 mg total) by mouth daily. Take 6 tabs by mouth daily  for 2 days, then 5 tabs for 2 days, then 4 tabs for 2 days, then 3 tabs for 2 days, 2 tabs for 2 days, then 1 tab by mouth daily for 2 days 03/20/15   Lelon Mast Tripp Dowless, PA-C   BP 119/73 mmHg  Pulse 88  Temp(Src) 98 F (36.7 C) (Oral)  Resp 18  Wt 192 lb (87.091 kg)  SpO2 94% Physical Exam  Constitutional:  Awake, alert, nontoxic appearance.  HENT:  Head: Atraumatic.  Eyes: Right eye exhibits no discharge. Left eye exhibits no discharge.  Neck: Neck supple.  Pulmonary/Chest: Effort normal. He exhibits no tenderness.  Abdominal: Soft. There is no tenderness. There is no rebound.  Musculoskeletal: He exhibits no tenderness.  Baseline ROM, no obvious new focal weakness. Diffuse, mild tenderness to right wrist with no focal bony tenderness. Full active range of motion. Distal pulses intact. Sensation intact to light touch.  Neurological:  Mental status and motor strength appears baseline for patient and situation.  Skin: No rash noted.  Psychiatric: He has a normal mood and affect.  Nursing note and vitals reviewed.   ED Course  Procedures (including critical care time) SPLINT APPLICATION Date/Time: 10:46 AM Authorized by: Sharlene Motts Consent:  Verbal consent obtained. Risks and benefits: risks, benefits and alternatives were discussed Consent given by: patient Splint applied by: orthopedic technician Location details: R wrist Splint type: velcro Supplies used: velcro splint Post-procedure: The splinted body part was neurovascularly unchanged following the procedure. Patient tolerance: Patient tolerated the procedure well with no immediate complications.    Labs Review Labs Reviewed - No data to display  Imaging Review No results  found. I have personally reviewed and evaluated these images and lab results as part of my medical decision-making.   EKG Interpretation None     Meds given in ED:  Medications - No data to display  New Prescriptions   No medications on file   Filed Vitals:   04/24/15 1032  BP: 119/73  Pulse: 88  Temp: 98 F (36.7 C)  TempSrc: Oral  Resp: 18  Weight: 192 lb (87.091 kg)  SpO2: 94%    MDM  Vitals stable - WNL -afebrile Pt resting comfortably in ED. PE--physical exam as above consistent with muscular skeletal/mild overuse injury. Neurovascularly intact. Encouraged wearing a wrist splint while doing vigorous activity. Also NSAIDs at home for symptoms before. No evidence of other acute or emergent pathology at this time. I discussed all relevant lab findings and imaging results with pt and they verbalized understanding. Discussed f/u with PCP within 48 hrs and return precautions, pt very amenable to plan.  Final diagnoses:  Right wrist pain      Joycie Peek, PA-C 04/24/15 [address], PA-C 04/24/15 1046  Linwood Dibbles, MD 04/27/15 [phone]

## 2015-04-24 NOTE — Discharge Instructions (Signed)
Please wear your splint while doing vigorous activity. You may take naproxen or Aleve for your discomfort. 2 pills in the morning for a total of 440 mg and 2 pills in the evening. Follow-up with your doctor as needed. Return to ED for worsening symptoms.  Arthralgia Your caregiver has diagnosed you as suffering from an arthralgia. Arthralgia means there is pain in a joint. This can come from many reasons including:  Bruising the joint which causes soreness (inflammation) in the joint.  Wear and tear on the joints which occur as we grow older (osteoarthritis).  Overusing the joint.  Various forms of arthritis.  Infections of the joint. Regardless of the cause of pain in your joint, most of these different pains respond to anti-inflammatory drugs and rest. The exception to this is when a joint is infected, and these cases are treated with antibiotics, if it is a bacterial infection. HOME CARE INSTRUCTIONS   Rest the injured area for as long as directed by your caregiver. Then slowly start using the joint as directed by your caregiver and as the pain allows. Crutches as directed may be useful if the ankles, knees or hips are involved. If the knee was splinted or casted, continue use and care as directed. If an stretchy or elastic wrapping bandage has been applied today, it should be removed and re-applied every 3 to 4 hours. It should not be applied tightly, but firmly enough to keep swelling down. Watch toes and feet for swelling, bluish discoloration, coldness, numbness or excessive pain. If any of these problems (symptoms) occur, remove the ace bandage and re-apply more loosely. If these symptoms persist, contact your caregiver or return to this location.  For the first 24 hours, keep the injured extremity elevated on pillows while lying down.  Apply ice for 15-20 minutes to the sore joint every couple hours while awake for the first half day. Then 03-04 times per day for the first 48 hours. Put  the ice in a plastic bag and place a towel between the bag of ice and your skin.  Wear any splinting, casting, elastic bandage applications, or slings as instructed.  Only take over-the-counter or prescription medicines for pain, discomfort, or fever as directed by your caregiver. Do not use aspirin immediately after the injury unless instructed by your physician. Aspirin can cause increased bleeding and bruising of the tissues.  If you were given crutches, continue to use them as instructed and do not resume weight bearing on the sore joint until instructed. Persistent pain and inability to use the sore joint as directed for more than 2 to 3 days are warning signs indicating that you should see a caregiver for a follow-up visit as soon as possible. Initially, a hairline fracture (break in bone) may not be evident on X-rays. Persistent pain and swelling indicate that further evaluation, non-weight bearing or use of the joint (use of crutches or slings as instructed), or further X-rays are indicated. X-rays may sometimes not show a small fracture until a week or 10 days later. Make a follow-up appointment with your own caregiver or one to whom we have referred you. A radiologist (specialist in reading X-rays) may read your X-rays. Make sure you know how you are to obtain your X-ray results. Do not assume everything is normal if you do not hear from [place]. SEEK MEDICAL CARE IF: Bruising, swelling, or pain increases. SEEK IMMEDIATE MEDICAL CARE IF:   Your fingers or toes are numb or blue.  The  pain is not responding to medications and continues to stay the same or get worse.  The pain in your joint becomes severe.  You develop a fever over 102 F (38.9 C).  It becomes impossible to move or use the joint. MAKE SURE YOU:   Understand these instructions.  Will watch your condition.  Will get help right away if you are not doing well or get worse. Document Released: 07/15/2005 Document Revised:  10/07/2011 Document Reviewed: 03/02/2008 [facility] Patient Information 2015 [place], [place]. This information is not intended to replace advice given to you by your health care provider. Make sure you discuss any questions you have with your health care provider.

## 2015-04-27 ENCOUNTER — Emergency Department (HOSPITAL_COMMUNITY)
Admission: EM | Admit: 2015-04-27 | Discharge: 2015-04-27 | Disposition: A | Discharge status: Home / Self Care | Payer: Medicaid Other | Attending: Emergency Medicine | Admitting: Emergency Medicine

## 2015-04-27 ENCOUNTER — Encounter (HOSPITAL_COMMUNITY): Payer: Self-pay | Admitting: Emergency Medicine

## 2015-04-27 DIAGNOSIS — Z79899 Other long term (current) drug therapy: Secondary | ICD-10-CM | POA: Insufficient documentation

## 2015-04-27 DIAGNOSIS — Z87828 Personal history of other (healed) physical injury and trauma: Secondary | ICD-10-CM | POA: Insufficient documentation

## 2015-04-27 DIAGNOSIS — G8929 Other chronic pain: Secondary | ICD-10-CM | POA: Insufficient documentation

## 2015-04-27 DIAGNOSIS — J45909 Unspecified asthma, uncomplicated: Secondary | ICD-10-CM | POA: Insufficient documentation

## 2015-04-27 DIAGNOSIS — I10 Essential (primary) hypertension: Secondary | ICD-10-CM | POA: Insufficient documentation

## 2015-04-27 DIAGNOSIS — Z720 Tobacco use: Secondary | ICD-10-CM | POA: Insufficient documentation

## 2015-04-27 DIAGNOSIS — M25531 Pain in right wrist: Secondary | ICD-10-CM | POA: Insufficient documentation

## 2015-04-27 NOTE — ED Notes (Signed)
Pt stated that he hurt his right  wrist on Tuesday while at work. Pt with good pulses, motor function, and sensation to right hand. No obvious deformities noted.

## 2015-04-27 NOTE — Discharge Instructions (Signed)
May take tylenol or motrin for pain. Follow-up with your primary care physician. Return here for new concerns.

## 2015-04-27 NOTE — ED Provider Notes (Signed)
CSN: [id number]     Arrival date & time 04/27/15  0541 History   First MD Initiated Contact with Patient 04/27/15 0559     Chief Complaint  Patient presents with  . Wrist Pain     (Consider location/radiation/quality/duration/timing/severity/associated sxs/prior Treatment) The history is provided by the patient and medical records.   29 y.o. M with hx of HTN, asthma, chronic wrist pain for the past several months, presenting to the ED for wrist pain.  Patient states he initially injured his wrist approx 6-7 months ago, intermittent issues since this time.  Patient states he normally wears wrist brace, however has since lost it.  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  Denies numbness/weakness of right arm or hand.  Has been taking OTC meds without relief.  VSS.  Past Medical History  Diagnosis Date  . Hypertension   . Asthma   . Wrist sprain    Past Surgical History  Procedure Laterality Date  . Mouth surgery     History reviewed. No pertinent family history. Social History  Substance Use Topics  . Smoking status: Current Every Day Smoker -- 0.50 packs/day    Types: Cigarettes  . Smokeless tobacco: Never Used  . Alcohol Use: No     Comment: former    Review of Systems  Musculoskeletal: Positive for arthralgias.  All other systems reviewed and are negative.     Allergies  Review of patient's allergies indicates no known allergies.  Home Medications   Prior to Admission medications   Medication Sig Start Date End Date Taking? Authorizing Yazaira Speas  albuterol (PROVENTIL HFA;VENTOLIN HFA) 108 (90 BASE) MCG/ACT inhaler Inhale 2 puffs into the lungs every 4 (four) hours as needed for wheezing or shortness of breath. Patient not taking: Reported on 12/17/2014 09/09/14   Harle Battiest, NP  albuterol (PROVENTIL HFA;VENTOLIN HFA) 108 (90 BASE) MCG/ACT inhaler Inhale 1-2 puffs into the lungs every 6 (six) hours as  needed for wheezing or shortness of breath. Patient not taking: Reported on 04/27/2015 03/20/15   Samantha Tripp Dowless, PA-C  predniSONE (STERAPRED UNI-PAK 21 TAB) 10 MG (21) TBPK tablet Take 1 tablet (10 mg total) by mouth daily. Take 6 tabs by mouth daily  for 2 days, then 5 tabs for 2 days, then 4 tabs for 2 days, then 3 tabs for 2 days, 2 tabs for 2 days, then 1 tab by mouth daily for 2 days Patient not taking: Reported on 04/27/2015 03/20/15   Samantha Tripp Dowless, PA-C   BP 138/74 mmHg  Pulse 87  Temp(Src) 98.2 F (36.8 C) (Oral)  Resp 16  Ht  (1.727 m)  Wt 191 lb (86.637 kg)  BMI 29.05 kg/m2  SpO2 95%   Physical Exam  Constitutional: He is oriented to person, place, and time. He appears well-developed and well-nourished. No distress.  Appears drowsy, intermittently falling asleep during exam  HENT:  Head: Normocephalic and atraumatic.  Mouth/Throat: Oropharynx is clear and moist.  Eyes: Conjunctivae and EOM are normal. Pupils are equal, round, and reactive to light.  Neck: Normal range of motion. Neck supple.  Cardiovascular: Normal rate, regular rhythm and normal heart sounds.   Pulmonary/Chest: Effort normal and breath sounds normal. No respiratory distress. He has no wheezes.  Musculoskeletal: Normal range of motion. He exhibits no edema.  No focal tenderness noted about the wrist; no bony deformities or swelling; full ROM maintained without difficulty; normal grip strength; normal  sensation throughout hand; strong radial pulse and cap refill  Neurological: He is alert and oriented to person, place, and time.  Skin: Skin is warm and dry. He is not diaphoretic.  Psychiatric: He has a normal mood and affect.  Nursing note and vitals reviewed.   ED Course  Procedures (including critical care time) Labs Review Labs Reviewed - No data to display  Imaging Review No results found. I have personally reviewed and evaluated these images and lab results as part of my  medical decision-making.   EKG Interpretation None      MDM   Final diagnoses:  Wrist pain, right   29 year old male here with right wrist pain. He has a history of chronic wrist pain and was seen here 3 days ago for the same. He tells me he lost his wrist brace, however was given a new one at his ED visit 3 days ago. Patient appears drowsy and is intermittently falling asleep during exam.  He has full range of motion of the wrists and hand is neurovascularly intact. Do not feel further work-up indicated at this time.  Patient will be d/c home, continue supportive care.  May buy wrist brace at pharmacy if needed as he has been given multiple ones from the ED.  Discussed plan with patient, he/she acknowledged understanding and agreed with plan of care.  Return precautions given for new or worsening symptoms.  Garlon Hatchet, PA-C 04/27/15 8295  [name], MD 04/28/15 [phone]

## 2015-04-27 NOTE — ED Notes (Signed)
Pt left with all belongings and ambulated out of treatment area.  

## 2015-06-14 ENCOUNTER — Emergency Department (HOSPITAL_COMMUNITY)
Admission: EM | Admit: 2015-06-14 | Discharge: 2015-06-14 | Disposition: A | Discharge status: Home / Self Care | Payer: Medicaid Other | Attending: Emergency Medicine | Admitting: Emergency Medicine

## 2015-06-14 ENCOUNTER — Encounter (HOSPITAL_COMMUNITY): Payer: Self-pay | Admitting: *Deleted

## 2015-06-14 DIAGNOSIS — Z87828 Personal history of other (healed) physical injury and trauma: Secondary | ICD-10-CM | POA: Insufficient documentation

## 2015-06-14 DIAGNOSIS — H01003 Unspecified blepharitis right eye, unspecified eyelid: Secondary | ICD-10-CM

## 2015-06-14 DIAGNOSIS — J45909 Unspecified asthma, uncomplicated: Secondary | ICD-10-CM | POA: Insufficient documentation

## 2015-06-14 DIAGNOSIS — F17210 Nicotine dependence, cigarettes, uncomplicated: Secondary | ICD-10-CM | POA: Insufficient documentation

## 2015-06-14 DIAGNOSIS — Z79899 Other long term (current) drug therapy: Secondary | ICD-10-CM | POA: Insufficient documentation

## 2015-06-14 DIAGNOSIS — I10 Essential (primary) hypertension: Secondary | ICD-10-CM | POA: Insufficient documentation

## 2015-06-14 DIAGNOSIS — H01001 Unspecified blepharitis right upper eyelid: Secondary | ICD-10-CM | POA: Insufficient documentation

## 2015-06-14 MED ORDER — FLUORESCEIN SODIUM 1 MG OP STRP
1.0000 | ORAL_STRIP | Freq: Once | OPHTHALMIC | Status: AC
  Administered 2015-06-14: 1 via OPHTHALMIC
  Filled 2015-06-14: qty 1

## 2015-06-14 MED ORDER — ERYTHROMYCIN 5 MG/GM OP OINT: TOPICAL_OINTMENT | OPHTHALMIC | Status: DC

## 2015-06-14 MED ORDER — TETRACAINE HCL 0.5 % OP SOLN
2.0000 [drp] | Freq: Once | OPHTHALMIC | Status: AC
  Administered 2015-06-14: 2 [drp] via OPHTHALMIC
  Filled 2015-06-14: qty 2

## 2015-06-14 NOTE — ED Provider Notes (Signed)
CSN: [id number]     Arrival date & time 06/14/15  1436 History  By signing my name below, I, Gwenyth Oberatherine Macek, attest that this documentation has been prepared under the direction and in the presence of Melburn HakeNicole Chimere Klingensmith, [place]-C.  Electronically Signed: Gwenyth Oberatherine Macek, ED Scribe. 06/14/2015. 3:46 PM.   Chief Complaint  Patient presents with  . Eye Problem   The history is provided by the patient. No language interpreter was used.    HPI Comments: Thomas Harrell is a 29 y.o. male who presents to the Emergency Department complaining of constant, moderate right eye swelling and redness that started 2 days ago. Pt reports itchiness of his eye, green drainage and blurred vision as associated symptoms. He also notes a foreign body sensation in the affected eye. He has not tried any treatment PTA. Pt does not wear contacts. He denies recent trauma to his eye. Pt also denies fever.   Past Medical History  Diagnosis Date  . Hypertension   . Asthma   . Wrist sprain    Past Surgical History  Procedure Laterality Date  . Mouth surgery     No family history on file. Social History  Substance Use Topics  . Smoking status: Current Every Day Smoker -- 0.50 packs/day    Types: Cigarettes  . Smokeless tobacco: Never Used  . Alcohol Use: No     Comment: former   Review of Systems  Constitutional: Negative for fever.  Eyes: Positive for pain, discharge, redness, itching and visual disturbance.   Allergies  Review of patient's allergies indicates no known allergies.  Home Medications   Prior to Admission medications   Medication Sig Start Date End Date Taking? Authorizing Provider  albuterol (PROVENTIL HFA;VENTOLIN HFA) 108 (90 BASE) MCG/ACT inhaler Inhale 2 puffs into the lungs every 4 (four) hours as needed for wheezing or shortness of breath. Patient not taking: Reported on 12/17/2014 09/09/14   Harle BattiestElizabeth Tysinger, NP  albuterol (PROVENTIL HFA;VENTOLIN HFA) 108 (90 BASE) MCG/ACT inhaler Inhale 1-2  puffs into the lungs every 6 (six) hours as needed for wheezing or shortness of breath. Patient not taking: Reported on 04/27/2015 03/20/15   Lester KinsmanSamantha Tripp Dowless, PA-C  erythromycin ophthalmic ointment Place a 1/2 inch ribbon of ointment into the lower eyelid. 06/14/15   Barrett HenleNicole Elizabeth Cutberto Winfree, PA-C  predniSONE (STERAPRED UNI-PAK 21 TAB) 10 MG (21) TBPK tablet Take 1 tablet (10 mg total) by mouth daily. Take 6 tabs by mouth daily  for 2 days, then 5 tabs for 2 days, then 4 tabs for 2 days, then 3 tabs for 2 days, 2 tabs for 2 days, then 1 tab by mouth daily for 2 days Patient not taking: Reported on 04/27/2015 03/20/15   Lelon MastSamantha Tripp Dowless, PA-C   BP 127/67 mmHg  Pulse 86  Temp(Src) 98 F (36.7 C)  Resp 18  Ht 5\' 8"  (1.727 m)  SpO2 96% Physical Exam  Constitutional: He appears well-developed and well-nourished. No distress.  HENT:  Head: Normocephalic and atraumatic.  Eyes: EOM are normal. Pupils are equal, round, and reactive to light. Lids are everted and swept, no foreign bodies found. Right eye exhibits no chemosis, no discharge, no exudate and no hordeolum. No foreign body present in the right eye. Left eye exhibits no chemosis, no discharge, no exudate and no hordeolum. No foreign body present in the left eye. Right conjunctiva is injected. Right conjunctiva has no hemorrhage. Left conjunctiva is not injected. Left conjunctiva has no hemorrhage. No scleral icterus.  Slit lamp exam:      The right eye shows no corneal abrasion, no corneal flare, no corneal ulcer, no foreign body, no hyphema, no fluorescein uptake and no anterior chamber bulge.  Right eye: Mild swelling and erythema to right upper lid  Neck: Neck supple. No tracheal deviation present.  Cardiovascular: Normal rate.   Pulmonary/Chest: Effort normal. No respiratory distress.  Skin: Skin is warm and dry.  Psychiatric: He has a normal mood and affect. His behavior is normal.  Nursing note and vitals reviewed.  ED  Course  Procedures  DIAGNOSTIC STUDIES: Oxygen Saturation is 97% on RA, normal by my interpretation.    COORDINATION OF CARE: 3:47 PM Discussed treatment plan with pt which includes a fluorescein test and erythromycin. Will refer to opthalmologist. Pt agreed to plan.  Labs Review Labs Reviewed - No data to display  Imaging Review No results found.  Filed Vitals:   06/14/15 1603  BP: 127/67  Pulse: 86  Temp:   Resp: 18     MDM  Patient presents with right eye redness, itching, drainage. Denies contact use. VSS. Exam revealed injected right conjunctiva and mild swelling of right upper eyelid.  No purulent discharge, corneal abrasions, entrapment, consensual photophobia, or dendritic staining with fluorescein study.  Presentation non-concerning for iritis, corneal abrasions/ulcer, or HSV. Will prescribe erythromycin ophthalmic ointment. Personal hygiene and frequent handwashing discussed.  Patient advised to followup with ophthalmologist.    Evaluation does not show pathology requring ongoing emergent intervention or admission. Pt is hemodynamically stable and mentating appropriately. Discussed findings/results and plan with patient/guardian, who agrees with plan. All questions answered. Return precautions discussed and outpatient follow up given.    Final diagnoses:  Blepharitis, right     I personally performed the services described in this documentation, which was scribed in my presence. The recorded information has been reviewed and is accurate.    Satira Sark [place], [place] 06/14/15 1923  Pricilla Loveless, MD 06/15/15 [phone]

## 2015-06-14 NOTE — Discharge Instructions (Signed)
Take medications as prescribed. You may also use ibuprofen as prescribed over-the-counter for pain relief. Follow-up with ophthalmology in the next 2-3 days. Return to the emergency department if symptoms worsen or new onset of fever, visual changes, worsening swelling/redness/drainage.   Emergency Department Resource Guide 1) Find a Doctor and Pay Out of Pocket Although you won't have to find out who is covered by your insurance plan, it is a good idea to ask around and get recommendations. You will then need to call the office and see if the doctor you have chosen will accept you as a new patient and what types of options they offer for patients who are self-pay. Some doctors offer discounts or will set up payment plans for their patients who do not have insurance, but you will need to ask so you aren't surprised when you get to your appointment.  2) Contact Your Local Health Department Not all health departments have doctors that can see patients for sick visits, but many do, so it is worth a call to see if yours does. If you don't know where your local health department is, you can check in your phone book. The CDC also has a tool to help you locate your state's health department, and many state websites also have listings of all of their local health departments.  3) Find a Walk-in Clinic If your illness is not likely to be very severe or complicated, you may want to try a walk in clinic. These are popping up all over the country in pharmacies, drugstores, and shopping centers. They're usually staffed by nurse practitioners or physician assistants that have been trained to treat common illnesses and complaints. They're usually fairly quick and inexpensive. However, if you have serious medical issues or chronic medical problems, these are probably not your best option.  No Primary Care Doctor: - Call Health Connect at  [phone] - they can help you locate a primary care doctor that  accepts your  insurance, provides certain services, etc. - Physician Referral Service- [phone]  Chronic Pain Problems: Organization         Address  Phone   Notes  [facility]  [phone] Patients need to be referred by their primary care doctor.   Medication Assistance: Organization         Address  Phone   Notes  [facility] Medication [facility] [address] [place], [place] [address] [phone] --Must be a resident of [facility] -- Must have NO insurance coverage whatsoever (no Medicaid/ Medicare, etc.) -- The pt. MUST have a primary care doctor that directs their care regularly and follows them in the community   MedAssist  [phone]) [phone]   [employer]  [phone]) [phone]    Agencies that provide inexpensive medical care: Organization         Address  Phone   Notes  [facility]  [phone]   [facility]    [phone]   [facility] [address] [place], [place] [address] [phone]   [facility] [address] [place], [place] [phone]) [phone]   [facility]    [phone]   [facility]    [phone]   [facility]  [address], [place] Phone:  [phone], Fax:  [phone] Hours of Operation:  9 am - 6 pm, M-F.  Also accepts Medicaid/Medicare and  self-pay.  [facility]  [address], [place] Phone: [phone], Fax: [phone]. Hours of Operation:  8:30 am - 5:30 pm, M-F.  Also accepts Medicaid and self-pay.  [facility] [place] [address] [place] Phone: [phone]   [facility] [address] Natasha Bence [place], [place] [phone], Ext. [phone] Mondays & Thursdays: 7-9 AM.  First 15 patients are seen on a first come, first serve basis.    Medicaid-accepting [facility] Providers:  Organization          Address  Phone   Notes  [facility] [address], [place] [phone] Also accepts self-pay patients.  [facility] [address] Laurell Josephs [place], [place]  [phone]   [facility] [address], [place] [phone]   [facility] [address] [place] [phone]   Renaye Rakers [address], [place]   [phone] Only accepts [place] patients after they have their name applied to their card.   Self-Pay (no insurance) in [facility]:  Organization         Address  Phone   Notes  Sickle Cell Patients, [facility] [address] [place], [place] [phone]   [facility] [address] [place], [place] [phone]   [facility] [place]  [address], [place] [phone]   [facility]/Dr. Osei-Bonsu  [address], [place] or [address], [place] [phone] Phone number for both [place] and [place] locations is the same.  [facility] [address] [place] [phone]   [facility] [address] [place] or [address] [phone]   [facility] [address] [place] [phone], phone; [phone], fax Sees patients 1st and 3rd Saturday of every month.  Must not qualify for public or private insurance (i.e. Medicaid, Medicare, [place] Health Choice, Veterans' Benefits)  Household income should be no more than 200% of the poverty level The clinic cannot treat you if you are pregnant or think you are pregnant  Sexually transmitted diseases are not treated at the clinic.    Dental Care: Organization         Address  Phone  Notes  [facility] [address] [place],  [place] [phone] Accepts children up to age 75 who are enrolled in [place] or [place] Health Choice; pregnant women with a Medicaid card; and children who have applied for Medicaid or [place] Health Choice, but were declined, whose parents can pay a reduced fee at time of service.  [facility] Department of [facility]  [address], [place] [phone] Accepts children up to age 84 who are enrolled in [place] or [place] Health Choice; pregnant women with a Medicaid card; and children who have applied for Medicaid or [place] Health Choice, but were declined, whose parents can pay a reduced fee at time of service.  [facility]  [address] [place], [place] [phone] Patients are seen by appointment only. Walk-ins are not accepted. [facility] will see patients 63 years of age and older. Monday - Tuesday (8am-5pm) Most Wednesdays (8:30-5pm) $  30 per visit, cash only  [facility] [facility] Access PRO[facility]  [address], [facility] [phone] Patients are seen by appointment only. Walk-ins are not accepted. [facility] will see patients 29 years of age and older. One Wednesday Evening (Monthly: Volunteer Based).  $30 per visit, cash only  [facility] [facility]  [phone] for adults; Children under age 274, call Graduate Pediatric Dentistry at [phone]. Children aged 464-14, please call [phone]) [phone] to request a pediatric application.  Dental services are provided in all areas of dental care including fillings, crowns and bridges, complete and partial dentures, implants, gum treatment, root canals, and extractions. Preventive care is also provided. Treatment is provided to both adults and children. Patients are selected via a lottery and there is often a waiting list.   [facility] [address], [place]  [phone] [email or web address]   [facility] [address], [place], [place]  [phone], Ext. [phone] Second and Fourth Thursday of each month, opens at 6:30 AM; Clinic ends at 9 AM.  Patients are seen on a first-come first-served basis, and a limited number are seen during each clinic.   [facility]  [address], [place], [place] [phone]   Eligibility Requirements You must have lived in [place], [place], or [place] counties for at least the last three months.   You cannot be eligible for state or federal sponsored National Cityhealthcare insurance, including CIGNAVeterans Administration, [place]icaid, or [employer]Medicare.   You generally cannot be eligible for healthcare insurance through your employer.    How to apply: Eligibility screenings are held every Tuesday and Wednesday afternoon from 1:00 pm until 4:00 pm. You do not need an appointment for the interview!  [facility] [address], [place], [place] [phone]   [facility]  [phone]   [facility]  [phone]   [facility]  [phone]    Behavioral Health Resources in the Community: Intensive Outpatient Programs Organization         Address  Phone  Notes  [facility] [address], [place], [place] [phone]   [facility] [address], [place], [place] [phone]   [facility] [address], [place], [place]  [phone]   [facility] [address],  [place], [place] [phone] or [phone]   Substance Abuse Resources Organization         Address  Phone  Notes  [facility]  [phone]   [facility]  [phone]   [facility]  [phone]   [facility]  [phone]   Residential & Outpatient Substance Abuse Program  [phone]   Psychological Services Organization         Address  Phone  Notes  [facility]  [phone]- [phone]   [facility]  [phone]- [phone]    [facility] [address], [place] [phone] or [phone]    Mobile Crisis Teams Organization         Address  Phone  Notes  [facility], Mobile Crisis Care Unit  [phone]   [facility]  [address] [place], [place] [phone]   Doristine LocksSharon DeEsch [address] [place] [phone]    Self-Help/Support Groups Organization         Address  Phone  Notes  [facility] of [place] - variety of support groups  336- [phone] Call for more information  Narcotics Anonymous (NA), [facility] [address], [place]  2 meetings at this location   Statisticianesidential Treatment Programs Organization         Address  Phone  Notes  [facility] [address],    [place]  [phone]   [facility]  [address], [place] [phone], [place], [place] [phone]   [facility] [address], [place] [phone] Admissions: 8am-3pm M-F  [facility] [address]    [place], [place] [phone]   [facility] [address], [place], [place] [phone]   [facility] [address]  [place], [place] [phone]   [facility] - Intensive Outpatient [address], [place], [place] [phone]   [facility] ([facility] [address]  [place], [place] [phone] or [phone]   [facility] ([facility]) [address] [place], [place] [phone] Accepts Medicaid  [facility] [address]  [place] [phone] Substance Abuse/Addiction Treatment   [facility] Resources Organization         Address  Phone  Notes  [facility]  [phone]) [phone]   Angie FavaJulie Brannon, PhD [address], [address] [place], [place]   [phone] or [phone]   [facility]    [address] [place], [place] [phone]   [facility] [address], [place], [place] [phone]) [phone] Insurance/Medicaid/sponsorship through [facility]  [facility]ith [facility] Families [address]                                    [place], [place] [phone] Therapy/tele-psych/case  [facility] [address].   [place], [place] [phone]    Dr. Lolly MustacheArfeen  [phone]   [facility]  [employer] [facility] 1) [address],  2) [address], [place] 3)  [address], [place] [phone]) [phone]   [facility] Child Abuse Hotline [phone](336) [phone] or [phone] (After Hours)

## 2015-06-14 NOTE — ED Notes (Signed)
Pt c/o right eye swelling and pain x 2 days. States that he also had green drainage. Eye is red and swollen.

## 2015-07-24 ENCOUNTER — Emergency Department (HOSPITAL_COMMUNITY)
Admission: EM | Admit: 2015-07-24 | Discharge: 2015-07-24 | Disposition: A | Discharge status: Home / Self Care | Payer: Medicaid Other

## 2015-07-24 NOTE — ED Notes (Signed)
Call no answer 

## 2015-07-24 NOTE — ED Notes (Signed)
No answer times 3.  

## 2015-08-04 IMAGING — CR DG RIBS W/ CHEST 3+V*L*
5 series · 5 of 5 positions shown · non-contrast
Comparison: Chest radiographs 09/09/2014

CLINICAL DATA: Left anterior rib pain after rib injury.

EXAM:
LEFT RIBS AND CHEST - 3+ VIEW

[chest pa]
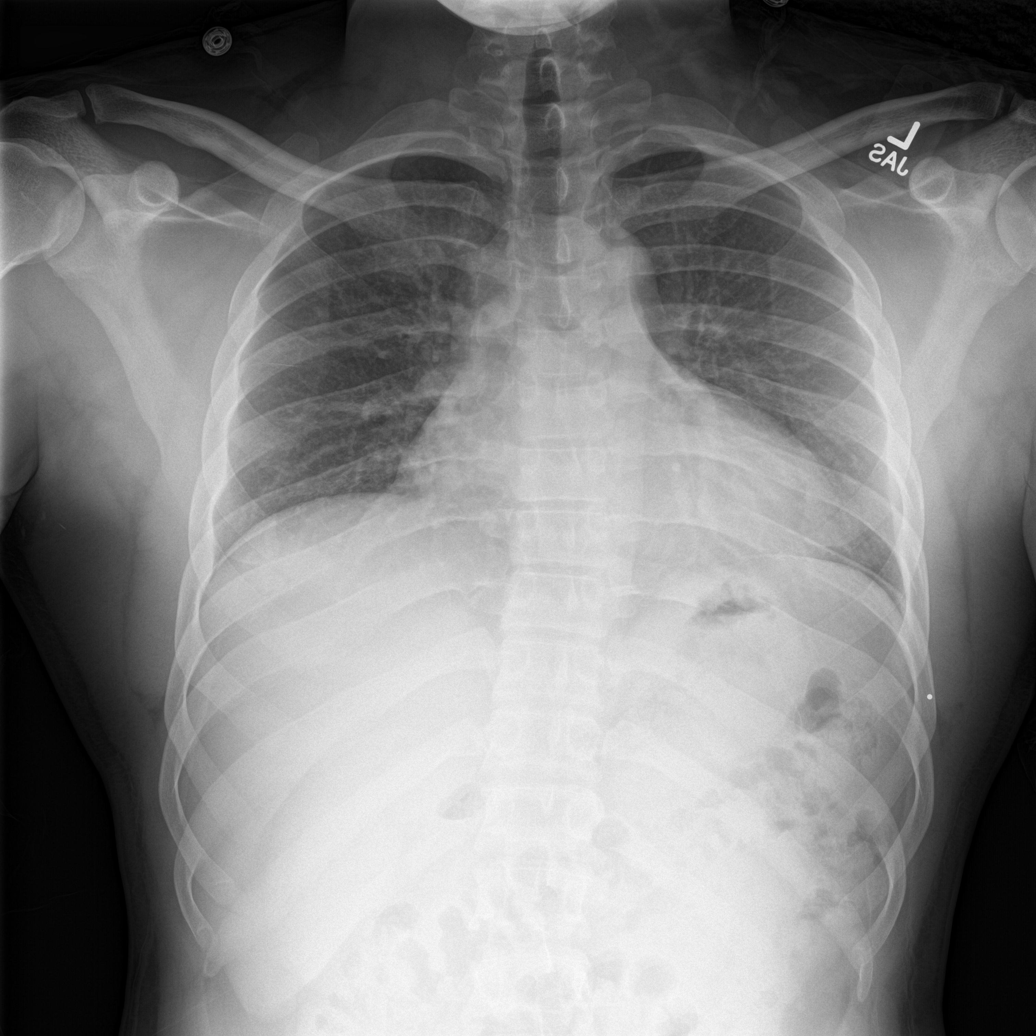

[rib pa obl (1 of 4)]
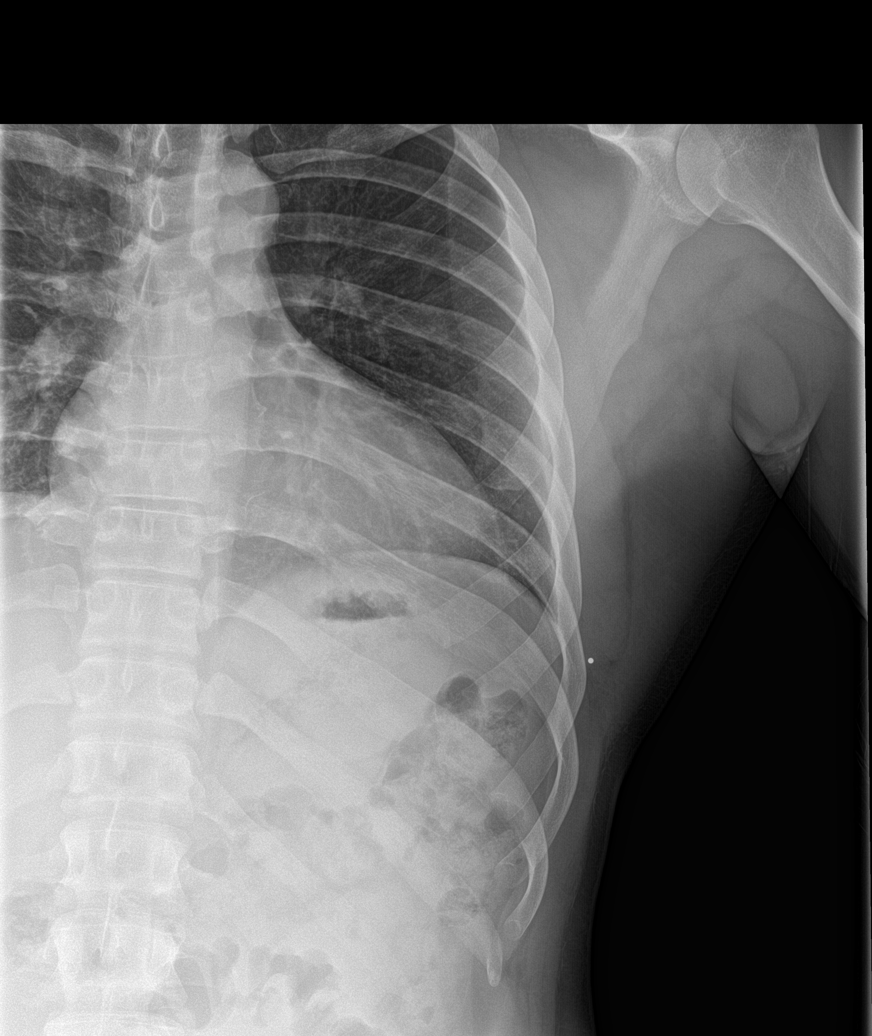

[rib pa obl (2 of 4)]
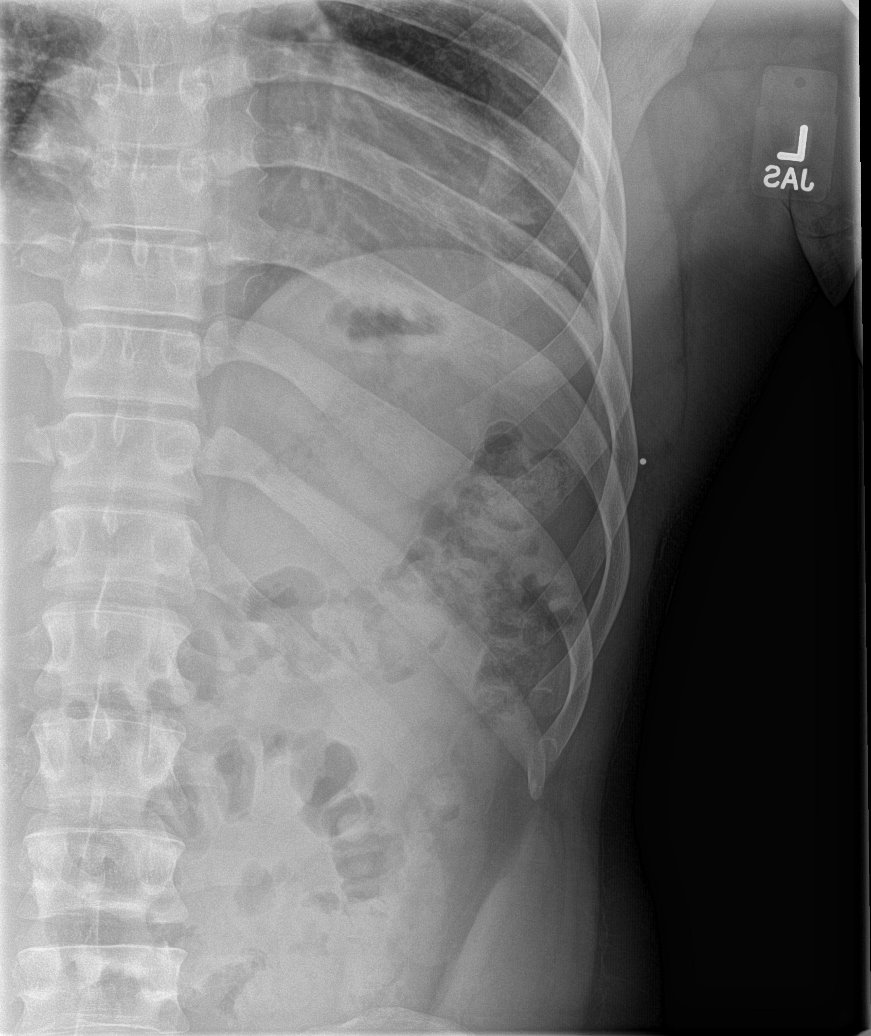

[rib pa obl (3 of 4)]
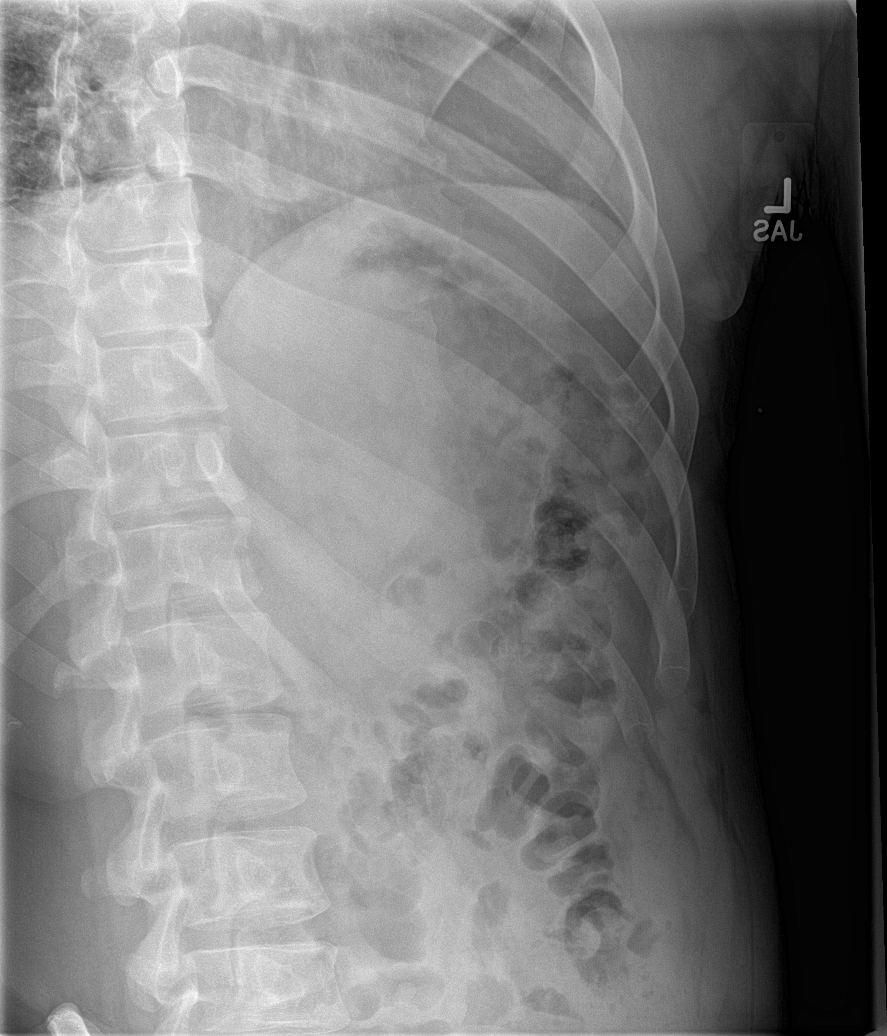

[rib pa obl (4 of 4)]
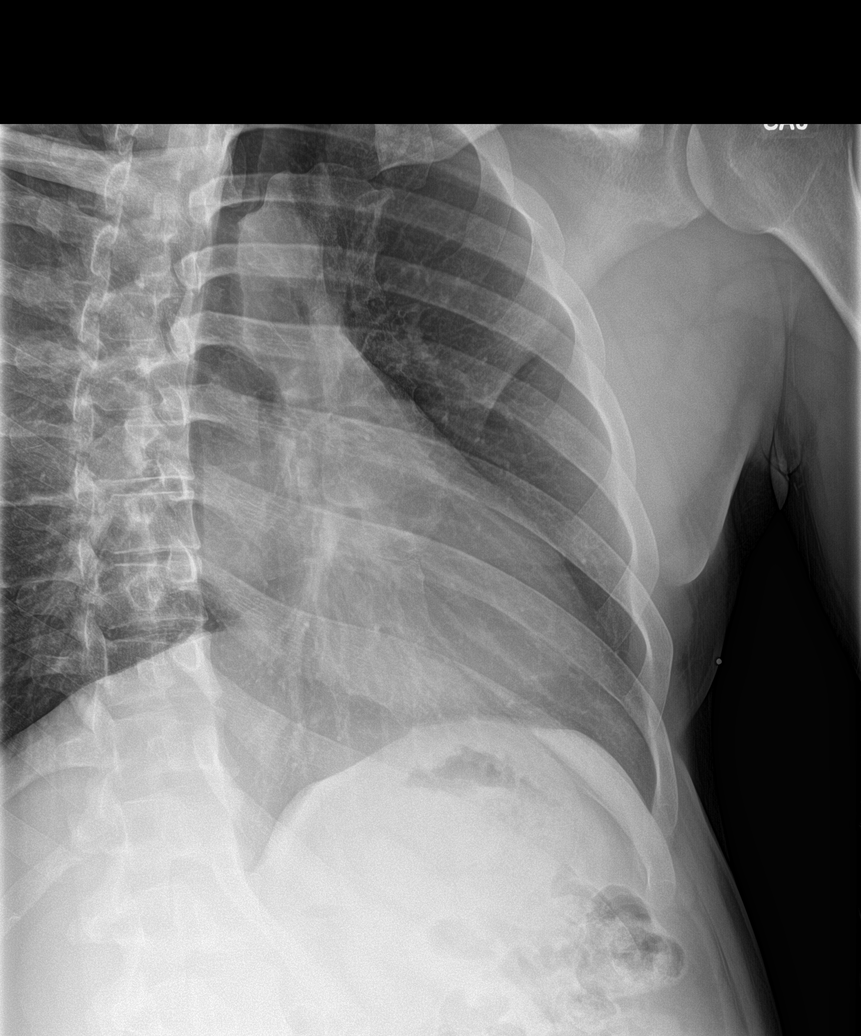

[5 of 5 positions shown; findings below may reference images not displayed]

FINDINGS: The cortical margins of the left ribs are intact. No fracture or
destructive rib lesion. Particularly, no fracture at site of pain
designated by BB. Lung volumes are low accentuating the cardiac
size. There is no consolidation, pleural effusion or pneumothorax.
IMPRESSION: Intact left ribs without acute fracture.

## 2015-09-07 ENCOUNTER — Emergency Department (HOSPITAL_COMMUNITY)
Admission: EM | Admit: 2015-09-07 | Discharge: 2015-09-07 | Disposition: A | Discharge status: Home / Self Care | Payer: Medicaid Other | Attending: Emergency Medicine | Admitting: Emergency Medicine

## 2015-09-07 ENCOUNTER — Encounter (HOSPITAL_COMMUNITY): Payer: Self-pay | Admitting: Emergency Medicine

## 2015-09-07 DIAGNOSIS — I10 Essential (primary) hypertension: Secondary | ICD-10-CM | POA: Insufficient documentation

## 2015-09-07 DIAGNOSIS — H0011 Chalazion right upper eyelid: Secondary | ICD-10-CM

## 2015-09-07 DIAGNOSIS — Z79899 Other long term (current) drug therapy: Secondary | ICD-10-CM | POA: Insufficient documentation

## 2015-09-07 DIAGNOSIS — Z87828 Personal history of other (healed) physical injury and trauma: Secondary | ICD-10-CM | POA: Insufficient documentation

## 2015-09-07 DIAGNOSIS — F17210 Nicotine dependence, cigarettes, uncomplicated: Secondary | ICD-10-CM | POA: Insufficient documentation

## 2015-09-07 DIAGNOSIS — J45909 Unspecified asthma, uncomplicated: Secondary | ICD-10-CM | POA: Insufficient documentation

## 2015-09-07 MED ORDER — ERYTHROMYCIN 5 MG/GM OP OINT: TOPICAL_OINTMENT | OPHTHALMIC | Status: DC

## 2015-09-07 NOTE — ED Notes (Signed)
Pt. reports stye at right eyelid onset 2 weeks ago , no visual changes .

## 2015-09-07 NOTE — Discharge Instructions (Signed)
Chalazion A chalazion is a swelling or lump on the eyelid. It can affect the upper or lower eyelid. CAUSES This condition may be caused by:  Long-lasting (chronic) inflammation of the eyelid glands.  A blocked oil gland in the eyelid. SYMPTOMS Symptoms of this condition include:  A swelling on the eyelid. The swelling may spread to areas around the eye.  A hard lump on the eyelid. This lump may make it hard to see out of the eye. DIAGNOSIS This condition is diagnosed with an examination of the eye. TREATMENT This condition is treated by applying a warm compress to the eyelid. If the condition does not improve after two days, it may be treated with:  Surgery.  Medicine that is injected into the chalazion by a health care provider.  Medicine that is applied to the eye. HOME CARE INSTRUCTIONS  Do not touch the chalazion.  Do not try to remove the pus, such as by squeezing the chalazion or sticking it with a pin or needle.  Do not rub your eyes.  Wash your hands often. Dry your hands with a clean towel.  Keep your face, scalp, and eyebrows clean.  Avoid wearing eye makeup.  Apply a warm, moist compress to the eyelid 4-6 times a day for 10-15 minutes at a time. This will help to open any blocked glands and help to reduce redness and swelling.  Apply over-the-counter and prescription medicines only as told by your health care provider.  If the chalazion does not break open (rupture) on its own in a month, return to your health care provider.  Keep all follow-up appointments as told by your health care provider. This is important. SEEK MEDICAL CARE IF:  Your eyelid has not improved in 4 weeks.  Your eyelid is getting worse.  You have a fever.  The chalazion does not rupture on its own with home treatment in a month. SEEK IMMEDIATE MEDICAL CARE IF:  You have pain in your eye.  Your vision changes.  The chalazion becomes painful or red  The chalazion gets  bigger.   This information is not intended to replace advice given to you by your health care provider. Make sure you discuss any questions you have with your health care provider.   Document Released: 07/12/2000 Document Revised: 04/05/2015 Document Reviewed: 11/07/2014 Elsevier Interactive Patient Education 2016 [employer]  Erythromycin eye ointment What is this medicine? ERYTHROMYCIN (er ith roe MYE sin) is a macrolide antibiotic. It is used to treat bacterial eye infections. It also prevents a certain type of eye infection that can occur in some babies. This medicine may be used for other purposes; ask your health care provider or pharmacist if you have questions. What should I tell my health care provider before I take this medicine? -if you have an unusual or allergic reaction to erythromycin, foods, dyes, or preservatives -pregnant or trying to get pregnant -breast-feeding How should I use this medicine? This medicine is only for use in the eye. Follow the directions on the prescription label. Wash hands before and after use. Tilt your head back slightly and pull your lower eyelid down with your index finger to form a pouch. Try not to touch the tip of the tube, to your eye, fingertips, or any other surface. Squeeze the end of the tube to apply a thin layer of the ointment to the inside of the lower eyelid. Close the eye gently to spread the ointment. Your vision may blur for a few  minutes. Use your doses at regular intervals. Do not use your medicine more often than directed. Finish the full course prescribed by your doctor or health care professional even if you think your condition is better. Do not stop using except on the advice of your doctor or health care professional. Talk to your pediatrician regarding the use of this medicine in children. Special care may be needed. Overdosage: If you think you have taken too much of this medicine contact a poison control center or emergency  room at once. NOTE: This medicine is only for you. Do not share this medicine with others. What if I miss a dose? If you miss a dose, use it as soon as you can. If it is almost time for your next dose, use only that dose. Do not use double or extra doses. What may interact with this medicine? Interactions are not expected. Do not use any other eye products without telling your doctor or health care professional. This list may not describe all possible interactions. Give your health care provider a list of all the medicines, herbs, non-prescription drugs, or dietary supplements you use. Also tell them if you smoke, drink alcohol, or use illegal drugs. Some items may interact with your medicine. What should I watch for while using this medicine? Tell your doctor or health care professional if your symptoms do not improve in 2 to 3 days. What side effects may I notice from receiving this medicine? Side effects that you should report to your doctor or health care professional as soon as possible: -allergic reactions like skin rash, itching or hives, swelling of the face, lips, or tongue -burning, stinging, or itching of the eyes or eyelids -changes in vision -redness, swelling, or pain This list may not describe all possible side effects. Call your doctor for medical advice about side effects. You may report side effects to FDA at 1-800-FDA-1088. Where should I keep my medicine? Keep out of the reach of children. Store at room temperature between 15 and 30 degrees C (59 and 86 degrees F). Do not freeze. Throw away any unused ointment after the expiration date. NOTE: This sheet is a summary. It may not cover all possible information. If you have questions about this medicine, talk to your doctor, pharmacist, or health care provider.    2016, Elsevier/Gold Standard. (2007-11-16 17:17:39)

## 2015-09-07 NOTE — ED Provider Notes (Signed)
CSN: [id number]     Arrival date & time 09/07/15  0234 History   First MD Initiated Contact with Patient 09/07/15 [phone]     Chief Complaint  Patient presents with  . Stye     (Consider location/radiation/quality/duration/timing/severity/associated sxs/prior Treatment) The history is provided by the patient.   30 year old male comes in with a painful swelling on the right upper eyelid for the last 2 weeks. He has tried treating it with Visine with no relief. Pain is moderate and he rates it at 7/10. Nothing makes it better nothing makes it worse. Of note, he was seen in the ED about 3 months ago with swelling of the lid of the same I and was treated with erythromycin ointment which did give him relief.  Past Medical History  Diagnosis Date  . Hypertension   . Asthma   . Wrist sprain    Past Surgical History  Procedure Laterality Date  . Mouth surgery     No family history on file. Social History  Substance Use Topics  . Smoking status: Current Every Day Smoker -- 0.00 packs/day    Types: Cigarettes  . Smokeless tobacco: Never Used  . Alcohol Use: Yes    Review of Systems  All other systems reviewed and are negative.     Allergies  Review of patient's allergies indicates no known allergies.  Home Medications   Prior to Admission medications   Medication Sig Start Date End Date Taking? Authorizing Provider  albuterol (PROVENTIL HFA;VENTOLIN HFA) 108 (90 BASE) MCG/ACT inhaler Inhale 2 puffs into the lungs every 4 (four) hours as needed for wheezing or shortness of breath. Patient not taking: Reported on 12/17/2014 09/09/14   Harle Battiest, NP  albuterol (PROVENTIL HFA;VENTOLIN HFA) 108 (90 BASE) MCG/ACT inhaler Inhale 1-2 puffs into the lungs every 6 (six) hours as needed for wheezing or shortness of breath. Patient not taking: Reported on 04/27/2015 03/20/15   Lester Kinsman Dowless, PA-C  erythromycin ophthalmic ointment Place a 1/2 inch ribbon of ointment into the lower  eyelid. 06/14/15   Barrett Henle, PA-C  predniSONE (STERAPRED UNI-PAK 21 TAB) 10 MG (21) TBPK tablet Take 1 tablet (10 mg total) by mouth daily. Take 6 tabs by mouth daily  for 2 days, then 5 tabs for 2 days, then 4 tabs for 2 days, then 3 tabs for 2 days, 2 tabs for 2 days, then 1 tab by mouth daily for 2 days Patient not taking: Reported on 04/27/2015 03/20/15   Samantha Tripp Dowless, PA-C   BP 138/77 mmHg  Pulse 109  Temp(Src) 98.2 F (36.8 C) (Oral)  Resp 22  Ht  (1.727 m)  Wt 194 lb (87.998 kg)  BMI 29.50 kg/m2  SpO2 96% Physical Exam  Nursing note and vitals reviewed.  30 year old male, resting comfortably and in no acute distress. Vital signs are significant for tachypnea and tachycardia. Oxygen saturation is 96%, which is normal. Head is normocephalic and atraumatic. PERRLA, EOMI. Oropharynx is clear. 6 mm raised and indurated area is present in the lateral aspect of the upper lid of the right eye. No fluctuance. No conjunctival injection. Lid was everted and no abnormalities were seen of the upper conjunctival sac. Neck is nontender and supple without adenopathy or JVD. Back is nontender and there is no CVA tenderness. Lungs are clear without rales, wheezes, or rhonchi. Chest is nontender. Heart has regular rate and rhythm without murmur. Abdomen is soft, flat, nontender without masses or hepatosplenomegaly and  peristalsis is normoactive. Extremities have no cyanosis or edema, full range of motion is present. Skin is warm and dry without rash. Neurologic: Mental status is normal, cranial nerves are intact, there are no motor or sensory deficits.  ED Course  Procedures (including critical care time)  MDM   Final diagnoses:  Chalazion of right upper eyelid    Solution of the right eye. Old records reviewed and he was diagnosed with blepharitis and an ED visit 3 months ago. He states that this action feels similar. I suspect that that was actually the  triggering event for this shoelace in. I do not feel it is a stye but he is referred to ophthalmology for further evaluation. Since he did have clinical response to erythromycin ointment previously, he is given a prescription for that tonight.    Dione Booze, MD 09/07/15 [phone]

## 2015-10-30 ENCOUNTER — Emergency Department (HOSPITAL_COMMUNITY)
Admission: EM | Admit: 2015-10-30 | Discharge: 2015-10-30 | Disposition: A | Discharge status: Home / Self Care | Payer: Self-pay | Attending: Emergency Medicine | Admitting: Emergency Medicine

## 2015-10-30 ENCOUNTER — Emergency Department (HOSPITAL_COMMUNITY): Payer: Self-pay

## 2015-10-30 DIAGNOSIS — X58XXXA Exposure to other specified factors, initial encounter: Secondary | ICD-10-CM | POA: Insufficient documentation

## 2015-10-30 DIAGNOSIS — H00011 Hordeolum externum right upper eyelid: Secondary | ICD-10-CM | POA: Insufficient documentation

## 2015-10-30 DIAGNOSIS — I10 Essential (primary) hypertension: Secondary | ICD-10-CM | POA: Insufficient documentation

## 2015-10-30 DIAGNOSIS — F17210 Nicotine dependence, cigarettes, uncomplicated: Secondary | ICD-10-CM | POA: Insufficient documentation

## 2015-10-30 DIAGNOSIS — Y998 Other external cause status: Secondary | ICD-10-CM | POA: Insufficient documentation

## 2015-10-30 DIAGNOSIS — Y9289 Other specified places as the place of occurrence of the external cause: Secondary | ICD-10-CM | POA: Insufficient documentation

## 2015-10-30 DIAGNOSIS — Y9389 Activity, other specified: Secondary | ICD-10-CM | POA: Insufficient documentation

## 2015-10-30 DIAGNOSIS — J45909 Unspecified asthma, uncomplicated: Secondary | ICD-10-CM | POA: Insufficient documentation

## 2015-10-30 DIAGNOSIS — S63502A Unspecified sprain of left wrist, initial encounter: Secondary | ICD-10-CM

## 2015-10-30 MED ORDER — IBUPROFEN 800 MG PO TABS: 800.0000 mg | ORAL_TABLET | Freq: Three times a day (TID) | ORAL | Status: DC

## 2015-10-30 NOTE — Discharge Instructions (Signed)
1. Medications: ibuprofen as needed for pain, continue usual home medications 2. Treatment: rest, drink plenty of fluids, ice affected area  3. Follow Up: Please follow up with your primary doctor or orthopedist listed in 3-5 days if no improvement in symptoms for discussion of your diagnoses and further evaluation after today's visit; Please return to the ER for new or worsening symptoms, any additional concerns.   COLD THERAPY DIRECTIONS:  Ice or gel packs can be used to reduce both pain and swelling. Ice is the most helpful within the first 24 to 48 hours after an injury or flareup from overusing a muscle or joint.  Ice is effective, has very few side effects, and is safe for most people to use.   If you expose your skin to cold temperatures for too long or without the proper protection, you can damage your skin or nerves. Watch for signs of skin damage due to cold.   HOME CARE INSTRUCTIONS  Follow these tips to use ice and cold packs safely.  Place a dry or damp towel between the ice and skin. A damp towel will cool the skin more quickly, so you may need to shorten the time that the ice is used.  For a more rapid response, add gentle compression to the ice.  Ice for no more than 10 to 20 minutes at a time. The bonier the area you are icing, the less time it will take to get the benefits of ice.  Check your skin after 5 minutes to make sure there are no signs of a poor response to cold or skin damage.  Rest 20 minutes or more in between uses.  Once your skin is numb, you can end your treatment. You can test numbness by very lightly touching your skin. The touch should be so light that you do not see the skin dimple from the pressure of your fingertip. When using ice, most people will feel these normal sensations in this order: cold, burning, aching, and numbness.

## 2015-10-30 NOTE — ED Notes (Signed)
Pt reports left wrist pain x 2 days, pt unsure of any injury. No swelling or obv injury noted. Pt also has swelling to right upper eyelid that he wants checked, pt thinks its a sty.

## 2015-10-30 NOTE — ED Provider Notes (Addendum)
CSN: [id number]     Arrival date & time 10/30/15  1840 History  By signing my name below, I, Soijett Blue, attest that this documentation has been prepared under the direction and in the presence of Elizabeth Sauer, PA-C Electronically Signed: Soijett Blue, ED Scribe. 10/30/2015. 8:22 PM.    Chief Complaint  Patient presents with  . Wrist Pain    The history is provided by the patient. No language interpreter was used.    Thomas Harrell is a 30 y.o. male who presents to the Emergency Department complaining of constant, sharp/stinging, left wrist pain onset 2 days. Pt reports that yesterday he did heavy lifting that he thinks contributed to his left wrist pain. Pt notes that his left wrist pain is worsened with movement of the left wrist.  Pt is having associated symptoms of tingling to left fingers with associated decreased grip strength due to pain. He notes that he has tried tylenol yesterday and ice with no relief of his symptoms. He denies swelling, left forearm pain, numbness, weakness, left elbow pain, color change, wound, rash, and any other symptoms.   Pt secondarily complains of swelling to right upper eyelid x 2 months. Pt reports that he would like to have checked at this time. Pt notes that the area is small in the morning and becomes larger throughout the day. Pt denies these symptoms ever occurring in the past. Pt has tried warm compresses without any medications for the relief of his symptoms. Pt denies any other symptoms.    Past Medical History  Diagnosis Date  . Hypertension   . Asthma   . Wrist sprain    Past Surgical History  Procedure Laterality Date  . Mouth surgery     No family history on file. Social History  Substance Use Topics  . Smoking status: Current Every Day Smoker -- 0.00 packs/day    Types: Cigarettes  . Smokeless tobacco: Never Used  . Alcohol Use: Yes    Review of Systems  Eyes:       Sty to right upper eyelid  Musculoskeletal: Positive for  arthralgias. Negative for joint swelling.  Skin: Negative for color change, rash and wound.  Neurological: Negative for weakness and numbness.       Tingling to left fingers    Allergies  Review of patient's allergies indicates no known allergies.  Home Medications   Prior to Admission medications   Medication Sig Start Date End Date Taking? Authorizing Provider  erythromycin ophthalmic ointment Place a 1/2 inch ribbon of ointment into the lower eyelid. 09/07/15   Dione Booze, MD  ibuprofen (ADVIL,MOTRIN) 800 MG tablet Take 1 tablet (800 mg total) by mouth 3 (three) times daily. 10/30/15   Jaime Pilcher Ward, PA-C   BP 126/75 mmHg  Pulse 87  Temp(Src) 98.9 F (37.2 C) (Oral)  Resp 18  SpO2 97% Physical Exam  Constitutional: He is oriented to person, place, and time. He appears well-developed and well-nourished. No distress.  HENT:  Head: Normocephalic and atraumatic.  Eyes: Conjunctivae and EOM are normal. Right eye exhibits hordeolum. Right eye exhibits no discharge.  Neck: Neck supple.  Cardiovascular: Normal rate, regular rhythm and normal heart sounds.  Exam reveals no gallop and no friction rub.   No murmur heard. Pulmonary/Chest: Effort normal and breath sounds normal. No respiratory distress. He has no wheezes. He has no rales.  Musculoskeletal: Normal range of motion.  No gross deformity noted. Patient has decreased range of motion secondary to  pain. There is no joint effusion noted. No erythema, or warmth overlaying the joint. There is tenderness to palpation over the dorsal wrist. The patient has normal sensation and motor function in the median, ulnar, and radial nerve distributions. There is no anatomic snuff box tenderness. The patient has normal active and passive range of motion of their digits. 2+ Radial pulse.  Neurological: He is alert and oriented to person, place, and time.  Skin: Skin is warm and dry.  Psychiatric: He has a normal mood and affect. His behavior is  normal.  Nursing note and vitals reviewed.   ED Course  ORTHOPEDIC INJURY TREATMENT Date/Time: 11/28/2015 4:47 PM Performed by: Janyth ContesWARD, JAIME PILCHER Authorized by: Janyth ContesWARD, JAIME PILCHER Consent: Verbal consent obtained. Risks and benefits: risks, benefits and alternatives were discussed Consent given by: patient Injury location: wrist Location details: left wrist Injury type: soft tissue Pre-procedure neurovascular assessment: neurovascularly intact Immobilization: splint Post-procedure neurovascular assessment: post-procedure neurovascularly intact   (including critical care time) DIAGNOSTIC STUDIES: Oxygen Saturation is 97% on RA, nl by my interpretation.    COORDINATION OF CARE: 8:21 PM Discussed treatment plan with pt at bedside which includes left wrist xray, ice, warm compresses and follow up with PCP PRN and pt agreed to plan.    Labs Review Labs Reviewed - No data to display  Imaging Review Dg Wrist Complete Left  10/30/2015  CLINICAL DATA:  Pain after twisting injury using car jack 1 day prior EXAM: LEFT WRIST - COMPLETE 3+ VIEW COMPARISON:  None. FINDINGS: Frontal, oblique, lateral, and ulnar deviation scaphoid images were obtained. There is no fracture or dislocation. The joint spaces appear normal. No erosive change. IMPRESSION: No fracture or dislocation.  No apparent arthropathy. Electronically Signed   By: Bretta BangWilliam  Woodruff III M.D.   On: 10/30/2015 20:52   I have personally reviewed and evaluated these images as part of my medical decision-making.   EKG Interpretation None      MDM   Final diagnoses:  Wrist sprain, left, initial encounter   Thomas Harrell presents with right wrist pain x2 days after heavy lifting. On exam wrist is mildly swollen and with tenderness to palpation. No erythema, warmth, concern for septic joint. Neurovascularly intact. X-rays were obtained which were unremarkable. RICE symptomatic care was discussed and wrist splint was provided in  ED. Ibuprofen Rx given. [facility] or PCP follow-up if no improvement in symptoms. All questions answered.  I personally performed the services described in this documentation, which was scribed in my presence. The recorded information has been reviewed and is accurate.   [facility] JoseJaime Pilcher Ward, PA-C 10/30/15 2146  Richardean Canalavid H Yao, MD 11/02/15 [phone]  Chase PicketJaime Pilcher Ward, PA-C 11/28/15 1648  Richardean Canalavid H Yao, MD 11/28/15 2229
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
## 2016-04-15 ENCOUNTER — Encounter (HOSPITAL_COMMUNITY): Payer: Self-pay | Admitting: Emergency Medicine

## 2016-04-15 ENCOUNTER — Emergency Department (HOSPITAL_COMMUNITY)
Admission: EM | Admit: 2016-04-15 | Discharge: 2016-04-15 | Disposition: A | Discharge status: Home / Self Care | Payer: Self-pay | Attending: Emergency Medicine | Admitting: Emergency Medicine

## 2016-04-15 ENCOUNTER — Emergency Department (HOSPITAL_COMMUNITY): Payer: Self-pay

## 2016-04-15 DIAGNOSIS — Y999 Unspecified external cause status: Secondary | ICD-10-CM | POA: Insufficient documentation

## 2016-04-15 DIAGNOSIS — W228XXA Striking against or struck by other objects, initial encounter: Secondary | ICD-10-CM | POA: Insufficient documentation

## 2016-04-15 DIAGNOSIS — Y929 Unspecified place or not applicable: Secondary | ICD-10-CM | POA: Insufficient documentation

## 2016-04-15 DIAGNOSIS — I10 Essential (primary) hypertension: Secondary | ICD-10-CM | POA: Insufficient documentation

## 2016-04-15 DIAGNOSIS — S20211A Contusion of right front wall of thorax, initial encounter: Secondary | ICD-10-CM | POA: Insufficient documentation

## 2016-04-15 DIAGNOSIS — J45909 Unspecified asthma, uncomplicated: Secondary | ICD-10-CM | POA: Insufficient documentation

## 2016-04-15 DIAGNOSIS — F17210 Nicotine dependence, cigarettes, uncomplicated: Secondary | ICD-10-CM | POA: Insufficient documentation

## 2016-04-15 DIAGNOSIS — Y9302 Activity, running: Secondary | ICD-10-CM | POA: Insufficient documentation

## 2016-04-15 MED ORDER — TRAMADOL HCL 50 MG PO TABS: 50.0000 mg | ORAL_TABLET | Freq: Four times a day (QID) | ORAL | 0 refills | Status: AC | PRN

## 2016-04-15 MED ORDER — IBUPROFEN 800 MG PO TABS: 800.0000 mg | ORAL_TABLET | Freq: Three times a day (TID) | ORAL | 0 refills | Status: DC

## 2016-04-15 NOTE — ED Provider Notes (Signed)
[facility]EMER[facility]ENCY DEPT Provider Note   CSN: [id number] Arrival date & time: 04/15/16  0423     History   Chief Complaint Chief Complaint  Patient presents with  . rib pain  . Shortness of Breath    HPI Thomas Harrell is a 30 y.o. male.  Patient presents with complaints of right rib injury. Patient reports that he was running and hit the right lateral ribs on a railing. He has been experiencing sharp pain in the right side of his chest and shortness of breath since this occurred. He did not hit his head or lose consciousness. No neck or back pain. No abdominal pain.      Past Medical History:  Diagnosis Date  . Asthma   . Hypertension   . Wrist sprain     There are no active problems to display for this patient.   Past Surgical History:  Procedure Laterality Date  . MOUTH SURGERY         Home Medications    Prior to Admission medications   Medication Sig Start Date End Date Taking? Authorizing Provider  erythromycin ophthalmic ointment Place a 1/2 inch ribbon of ointment into the lower eyelid. 09/07/15   Dione Boozeavid Glick, MD  ibuprofen (ADVIL,MOTRIN) 800 MG tablet Take 1 tablet (800 mg total) by mouth 3 (three) times daily. 10/30/15   Chase PicketJaime Pilcher Ward, PA-C    Family History No family history on file.  Social History Social History  Substance Use Topics  . Smoking status: Current Every Day Smoker    Packs/day: 0.00    Types: Cigarettes  . Smokeless tobacco: Never Used  . Alcohol use Yes     Allergies   Review of patient's allergies indicates no known allergies.   Review of Systems Review of Systems  Respiratory: Positive for shortness of breath.   All other systems reviewed and are negative.    Physical Exam Updated Vital Signs BP 138/79 (BP Location: Right Arm)   Pulse 92   Temp 98.5 F (36.9 C) (Oral)   Resp 20   Ht 5\' 8"  (1.727 m)   Wt 196 lb (88.9 kg)   SpO2 94%   BMI 29.80 kg/m   Physical Exam  Constitutional: He is oriented to  person, place, and time. He appears well-developed and well-nourished. No distress.  HENT:  Head: Normocephalic and atraumatic.  Right Ear: Hearing normal.  Left Ear: Hearing normal.  Nose: Nose normal.  Mouth/Throat: Oropharynx is clear and moist and mucous membranes are normal.  Eyes: Conjunctivae and EOM are normal. Pupils are equal, round, and reactive to light.  Neck: Normal range of motion. Neck supple.  Cardiovascular: Regular rhythm, S1 normal and S2 normal.  Exam reveals no gallop and no friction rub.   No murmur heard. Pulmonary/Chest: Effort normal and breath sounds normal. No respiratory distress. He exhibits tenderness (Right midaxillary line). He exhibits no crepitus.  Abdominal: Soft. Normal appearance and bowel sounds are normal. There is no hepatosplenomegaly. There is no tenderness. There is no rebound, no guarding, no tenderness at McBurney's point and negative Murphy's sign. No hernia.  Musculoskeletal: Normal range of motion.  Neurological: He is alert and oriented to person, place, and time. He has normal strength. No cranial nerve deficit or sensory deficit. Coordination normal. GCS eye subscore is 4. GCS verbal subscore is 5. GCS motor subscore is 6.  Skin: Skin is warm, dry and intact. No rash noted. No cyanosis.  Psychiatric: He has a normal mood and affect.  His speech is normal and behavior is normal. Thought content normal.  Nursing note and vitals reviewed.    ED Treatments / Results  Labs (all labs ordered are listed, but only abnormal results are displayed) Labs Reviewed - No data to display  EKG  EKG Interpretation None       Radiology Dg Ribs Unilateral W/chest Right  Result Date: 04/15/2016 CLINICAL DATA:  Patient fell while running tonight. Landed on the right ribs. EXAM: RIGHT RIBS AND CHEST - 3+ VIEW COMPARISON:  Chest 03/20/2015 FINDINGS: Slightly shallow inspiration. Normal heart size and pulmonary vascularity. No focal airspace disease or  consolidation in the lungs. No blunting of costophrenic angles. No pneumothorax. Mediastinal contours appear intact. Right ribs appear intact. No acute or displaced fractures identified. No focal bone lesion or bone destruction. IMPRESSION: No evidence of active pulmonary disease.  Negative right ribs. Electronically Signed   By: Burman Nieves M.D.   On: 04/15/2016 05:10    Procedures Procedures (including critical care time)  Medications Ordered in ED Medications - No data to display   Initial Impression / Assessment and Plan / ED Course  I have reviewed the triage vital signs and the nursing notes.  Pertinent labs & imaging results that were available during my care of the patient were reviewed by me and considered in my medical decision making (see chart for details).  Clinical Course   Patient presents with complaints of right sided chest wall pain after blunt trauma. Patient complaining of pain in the midaxillary line in the right mid rib field. X-ray of ribs and lungs does not show any abnormality. There are no displaced rib fractures, no pneumothorax or pulmonary contusion. Patient does not have injury elsewhere. Careful examination of his abdomen does not reveal any right upper quadrant tenderness to suggest liver injury. Patient appropriate for analgesia and rest, given pulmonary toilet instructions.  Final Clinical Impressions(s) / ED Diagnoses   Final diagnoses:  Chest wall contusion, right, initial encounter    New Prescriptions New Prescriptions   No medications on file     Gilda Crease, MD 04/15/16 [phone]

## 2016-04-15 NOTE — ED Triage Notes (Signed)
Pt states he was running at midnight and hit R ribs on a railing.  C/o R rib pain and sob.

## 2016-06-22 ENCOUNTER — Emergency Department (HOSPITAL_COMMUNITY)
Admission: EM | Admit: 2016-06-22 | Discharge: 2016-06-23 | Disposition: A | Discharge status: Home / Self Care | Payer: Medicaid Other | Attending: Emergency Medicine | Admitting: Emergency Medicine

## 2016-06-22 ENCOUNTER — Encounter (HOSPITAL_COMMUNITY): Payer: Self-pay | Admitting: *Deleted

## 2016-06-22 ENCOUNTER — Emergency Department (HOSPITAL_COMMUNITY): Payer: Medicaid Other

## 2016-06-22 ENCOUNTER — Other Ambulatory Visit: Payer: Self-pay

## 2016-06-22 DIAGNOSIS — Z79899 Other long term (current) drug therapy: Secondary | ICD-10-CM | POA: Insufficient documentation

## 2016-06-22 DIAGNOSIS — G43109 Migraine with aura, not intractable, without status migrainosus: Secondary | ICD-10-CM

## 2016-06-22 DIAGNOSIS — J45909 Unspecified asthma, uncomplicated: Secondary | ICD-10-CM | POA: Insufficient documentation

## 2016-06-22 DIAGNOSIS — Z5181 Encounter for therapeutic drug level monitoring: Secondary | ICD-10-CM | POA: Insufficient documentation

## 2016-06-22 DIAGNOSIS — I10 Essential (primary) hypertension: Secondary | ICD-10-CM | POA: Insufficient documentation

## 2016-06-22 DIAGNOSIS — G43909 Migraine, unspecified, not intractable, without status migrainosus: Secondary | ICD-10-CM

## 2016-06-22 DIAGNOSIS — F17210 Nicotine dependence, cigarettes, uncomplicated: Secondary | ICD-10-CM | POA: Insufficient documentation

## 2016-06-22 LAB — COMPREHENSIVE METABOLIC PANEL
ALBUMIN: 4.4 g/dL (ref 3.5–5.0)
ALK PHOS: 48 U/L (ref 38–126)
ALT: 40 U/L (ref 17–63)
AST: 41 U/L (ref 15–41)
Anion gap: 7 (ref 5–15)
BILIRUBIN TOTAL: 0.4 mg/dL (ref 0.3–1.2)
BUN: 11 mg/dL (ref 6–20)
CALCIUM: 9.5 mg/dL (ref 8.9–10.3)
CO2: 24 mmol/L (ref 22–32)
CREATININE: 1.23 mg/dL (ref 0.61–1.24)
Chloride: 107 mmol/L (ref 101–111)
GFR calc Af Amer: >60 mL/min (ref >60)
GLUCOSE: 83 mg/dL (ref 65–99)
Potassium: 4 mmol/L (ref 3.5–5.1)
Sodium: 138 mmol/L (ref 135–145)
TOTAL PROTEIN: 7.7 g/dL (ref 6.5–8.1)

## 2016-06-22 LAB — CBC
HEMATOCRIT: 45.6 % (ref 39.0–52.0)
HEMOGLOBIN: 15.1 g/dL (ref 13.0–17.0)
MCH: 28.1 pg (ref 26.0–34.0)
MCHC: 33.1 g/dL (ref 30.0–36.0)
MCV: 84.8 fL (ref 78.0–100.0)
Platelets: 309 10*3/uL (ref 150–400)
RBC: 5.38 MIL/uL (ref 4.22–5.81)
RDW: 13.1 % (ref 11.5–15.5)
WBC: 13.4 10*3/uL — ABNORMAL HIGH (ref 4.0–10.5)

## 2016-06-22 LAB — DIFFERENTIAL
BASOS ABS: 0 10*3/uL (ref 0.0–0.1)
Basophils Relative: 0 %
EOS ABS: 0.2 10*3/uL (ref 0.0–0.7)
Eosinophils Relative: 1 %
LYMPHS ABS: 4.3 10*3/uL — AB (ref 0.7–4.0)
LYMPHS PCT: 32 %
MONOS PCT: 6 %
Monocytes Absolute: 0.8 10*3/uL (ref 0.1–1.0)
NEUTROS PCT: 61 %
Neutro Abs: 8.1 10*3/uL — ABNORMAL HIGH (ref 1.7–7.7)

## 2016-06-22 LAB — I-STAT CHEM 8, ED
BUN: 11 mg/dL (ref 6–20)
CALCIUM ION: 1.22 mmol/L (ref 1.15–1.40)
CREATININE: 1.2 mg/dL (ref 0.61–1.24)
Chloride: 106 mmol/L (ref 101–111)
GLUCOSE: 77 mg/dL (ref 65–99)
HCT: 47 % (ref 39.0–52.0)
HEMOGLOBIN: 16 g/dL (ref 13.0–17.0)
Potassium: 3.8 mmol/L (ref 3.5–5.1)
Sodium: 140 mmol/L (ref 135–145)
TCO2: 24 mmol/L (ref 0–100)

## 2016-06-22 LAB — APTT: aPTT: 33 seconds (ref 24–36)

## 2016-06-22 LAB — I-STAT TROPONIN, ED: TROPONIN I, POC: 0 ng/mL (ref 0.00–0.08)

## 2016-06-22 LAB — PROTIME-INR
INR: 1.02
Prothrombin Time: 13.4 seconds (ref 11.4–15.2)

## 2016-06-22 MED ORDER — SODIUM CHLORIDE 0.9 % IV BOLUS (SEPSIS)
1000.0000 mL | Freq: Once | INTRAVENOUS | Status: AC
  Administered 2016-06-22: 1000 mL via INTRAVENOUS

## 2016-06-22 MED ORDER — PROCHLORPERAZINE EDISYLATE 5 MG/ML IJ SOLN
10.0000 mg | Freq: Once | INTRAMUSCULAR | Status: AC
  Administered 2016-06-22: 10 mg via INTRAVENOUS
  Filled 2016-06-22: qty 2

## 2016-06-22 MED ORDER — DIPHENHYDRAMINE HCL 50 MG/ML IJ SOLN
25.0000 mg | Freq: Once | INTRAMUSCULAR | Status: AC
  Administered 2016-06-22: 25 mg via INTRAVENOUS
  Filled 2016-06-22: qty 1

## 2016-06-22 MED ORDER — KETOROLAC TROMETHAMINE 15 MG/ML IJ SOLN
15.0000 mg | Freq: Once | INTRAMUSCULAR | Status: AC
  Administered 2016-06-22: 15 mg via INTRAVENOUS
  Filled 2016-06-22: qty 1

## 2016-06-22 NOTE — ED Notes (Signed)
Pt in CT at this time with RN

## 2016-06-22 NOTE — Discharge Instructions (Signed)
Your symptoms today were likely due to a complex migraine.   If you experience frequent headaches you should ask your doctor for referral to neurology for further work up.   If you experience severely worsening headaches, with numbness, vision changes, difficulty walking or other symptoms return to the emergency department for evaluation.

## 2016-06-22 NOTE — ED Triage Notes (Addendum)
PT reports real bad headache that started today when visiting his brother upstairs. Then started having tingling on left face and down left arm and left leg.  Pt states feels funny walking.  NO chest pain.  Feels weaker on left side.  Pt dad had stroke at young age.  Pt has right sided headache. Pt reports black spots in vision.  Symptoms started at 2015.

## 2016-06-22 NOTE — Consult Note (Signed)
Neurology Consultation Reason for Consult: Left-sided paresthesia Referring Physician: Little, R  CC: Left-sided paresthesia  History is obtained from:Patient  HPI: Thomas Harrell is a 30 y.o. male Who presents with left-sided paresthesia and numbness as well as spots in his vision that started about 30 minutes prior to arrival. [facility] and noticed numbness/tingling start in his head and then gradually spread down his left side of his body.  He states that he does get headaches that make him go lay down in a dark room from time to time. He had a similar episode several years ago with left facial and going and numbness associated with a severe photophobic headache.  He currently endorses a severe right sided headache associated with photophobia.    LKW: 8:15 p.m. tpa given?: no, Mild symptoms    ROS: A 14 point ROS was performed and is negative except as noted in the HPI.   Past Medical History:  Diagnosis Date  . Asthma   . Hypertension   . Wrist sprain      Family history-migraine   Social History:  reports that he has been smoking Cigarettes.  He has been smoking about 0.00 packs per day. He has never used smokeless tobacco. He reports that he drinks alcohol. He reports that he does not use drugs.   Exam: Current vital signs: BP 141/86   Pulse 82   Temp 97.9 F (36.6 C) (Oral)   Resp 18   Ht 5\' 8"  (1.727 m)   Wt 89.4 kg (197 lb 1 oz)   SpO2 98%   BMI 29.96 kg/m  Vital signs in last 24 hours: Temp:  [97.9 F (36.6 C)] 97.9 F (36.6 C) (11/25 2039) Pulse Rate:  [76-82] 82 (11/25 2115) Resp:  [17-18] 18 (11/25 2115) BP: (141-150)/(86-98) 141/86 (11/25 2115) SpO2:  [98 %-99 %] 98 % (11/25 2115) Weight:  [89.4 kg (197 lb 1 oz)] 89.4 kg (197 lb 1 oz) (11/25 2047)   Physical Exam  Constitutional: Appears well-developed and well-nourished.  Psych: Affect appropriate to situation Eyes: No scleral injection HENT: No OP obstrucion Head:  Normocephalic.  Cardiovascular: Normal rate and regular rhythm.  Respiratory: Effort normal and breath sounds normal to anterior ascultation GI: Soft.  No distension. There is no tenderness.  Skin: WDI  Neuro: Mental Status: Patient is awake, alert, oriented to person, place, month, year, and situation. Patient is able to give a clear and coherent history. No signs of aphasia or neglect Cranial Nerves: II: Visual Fields are full. Pupils are equal, round, and reactive to light.   III,IV, VI: EOMI without ptosis or diploplia.  V: Facial sensation is Diminished on the left VII: Facial movement is symmetric.  VIII: hearing is intact to voice X: Uvula elevates symmetrically XI: Shoulder shrug is symmetric. XII: tongue is midline without atrophy or fasciculations.  Motor: Tone is normal. Bulk is normal. 5/5 strength was present in all four extremities.  Sensory: Sensation is Diminished on the left Deep Tendon Reflexes: 2+ and symmetric in the biceps and patellae.  Plantars: Toes are downgoing bilaterally.  Cerebellar: FNF and HKS are intact bilaterally      I have reviewed labs in epic and the results pertinent to this consultation are: CMP-unremarkable  I have reviewed the images obtained:CT head-unremarkable  Impression: 30 year old male with migrating paresthesia in the setting of a severe photophobic headache. Especially with his History of a similar episode, I would favor this representing complex migraine.  I would treat this as such, and if his symptoms  Resolve, no further workup at this   Recommendations: 1) Compazine, Benadryl, Toradol 2) further workup only if he has persistent symptoms following this.    Ritta SlotMcNeill Kyngston Pickelsimer, MD [facility] [phone]  If 7pm- 7am, please page neurology on call as listed in [facility].

## 2016-06-22 NOTE — ED Notes (Signed)
Ambulated pt from room D33 down to Trauma B and back again. Pt states he feels good.

## 2016-06-22 NOTE — ED Provider Notes (Signed)
[facility]PT Provider Note   CSN: [id number] Arrival date & time: 06/22/16  2033   An emergency department physician performed an initial assessment on this suspected stroke patient at 2050.  History   Chief Complaint Chief Complaint  Patient presents with  . Code Stroke    HPI Thomas Harrell is a 30 y.o. male.  HPI Patient is a 30 year old male with past mental history of hypertension and asthma who presents with a left-sided paresthesias. Patient reports he was at [facility] about 30 minutes prior to arrival when he began to notice spots in front of his vision and photophobia. He developed a right-sided headache. He then began to have numbness and tingling over his left nose that extended down his left jaw and down his left arm. He reports the whole left side of his body felt numb/tingly and weak. No slurred speech or facial droop noted by girlfriend who was with patient at the time. Patient reports a history of prior headaches with associated photophobia although he does not have them often. Denies nausea, vomiting, fevers or recent illness. Patient has been under a lot of stress lately as twin brother is in the ICU following a car accident.  Past Medical History:  Diagnosis Date  . Asthma   . Hypertension   . Wrist sprain     There are no active problems to display for this patient.   Past Surgical History:  Procedure Laterality Date  . MOUTH SURGERY         Home Medications    Prior to Admission medications   Medication Sig Start Date End Date Taking? Authorizing Provider  ibuprofen (ADVIL,MOTRIN) 800 MG tablet Take 1 tablet (800 mg total) by mouth 3 (three) times daily. 04/15/16   Gilda Crease, MD  traMADol (ULTRAM) 50 MG tablet Take 1 tablet (50 mg total) by mouth every 6 (six) hours as needed. 04/15/16   Gilda Crease, MD    Family History No family history on file.  Social History Social History  Substance Use Topics  . Smoking status:  Current Every Day Smoker    Packs/day: 0.00    Types: Cigarettes  . Smokeless tobacco: Never Used  . Alcohol use Yes     Allergies   Patient has no known allergies.   Review of Systems Review of Systems  Constitutional: Negative for chills and fever.  HENT: Negative for ear pain and sore throat.   Eyes: Positive for photophobia and visual disturbance. Negative for pain.  Respiratory: Negative for cough and shortness of breath.   Cardiovascular: Negative for chest pain and palpitations.  Gastrointestinal: Negative for abdominal pain and vomiting.  Genitourinary: Negative for dysuria and hematuria.  Musculoskeletal: Negative for arthralgias and back pain.  Skin: Negative for color change and rash.  Neurological: Positive for numbness and headaches. Negative for seizures and syncope.  All other systems reviewed and are negative.    Physical Exam Updated Vital Signs BP 131/80   Pulse 82   Temp 97.9 F (36.6 C) (Oral)   Resp 16   Ht 5\' 8"  (1.727 m)   Wt 89.4 kg   SpO2 93%   BMI 29.96 kg/m   Physical Exam  Constitutional: He appears well-developed and well-nourished.  HENT:  Head: Normocephalic and atraumatic.  Eyes: Conjunctivae are normal.  Neck: Neck supple.  Cardiovascular: Normal rate and regular rhythm.   No murmur heard. Pulmonary/Chest: Effort normal and breath sounds normal. No respiratory distress.  Abdominal: Soft. There is  no tenderness.  Musculoskeletal: He exhibits no edema.  Neurological: He is alert. He has normal strength and normal reflexes. No cranial nerve deficit or sensory deficit. He displays a negative Romberg sign. Coordination and gait normal. GCS eye subscore is 4. GCS verbal subscore is 5. GCS motor subscore is 6.  Skin: Skin is warm and dry.  Psychiatric: He has a normal mood and affect.  Nursing note and vitals reviewed.    ED Treatments / Results  Labs (all labs ordered are listed, but only abnormal results are displayed) Labs  Reviewed  CBC - Abnormal; Notable for the following:       Result Value   WBC 13.4 (*)    All other components within normal limits  DIFFERENTIAL - Abnormal; Notable for the following:    Neutro Abs 8.1 (*)    Lymphs Abs 4.3 (*)    All other components within normal limits  PROTIME-INR  APTT  COMPREHENSIVE METABOLIC PANEL  I-STAT TROPOININ, ED  I-STAT CHEM 8, ED  CBG MONITORING, ED    EKG  EKG Interpretation  Date/Time:  Saturday June 22 2016 20:44:35 EST Ventricular Rate:  69 PR Interval:  182 QRS Duration: 78 QT Interval:  374 QTC Calculation: 400 R Axis:   50 Text Interpretation:  Normal sinus rhythm Normal ECG No significant change since last tracing Confirmed by LITTLE MD, RACHEL [phone]([id number]) on 06/22/2016 10:59:32 PM       Radiology Ct Head Code Stroke W/o Cm  Result Date: 06/22/2016 CLINICAL DATA:  Code stroke.  Left-sided weakness. EXAM: CT HEAD WITHOUT CONTRAST TECHNIQUE: Contiguous axial images were obtained from the base of the skull through the vertex without intravenous contrast. COMPARISON:  None. FINDINGS: Brain: No mass lesion, intraparenchymal hemorrhage or extra-axial collection. No evidence of acute cortical infarct. Brain parenchyma and CSF-containing spaces are normal for age. Vascular: No hyperdense vessel or unexpected calcification. Skull: Normal visualized skull base, calvarium and extracranial soft tissues. Sinuses/Orbits: No sinus fluid levels or advanced mucosal thickening. No mastoid effusion. Normal orbits. ASPECTS [facility]([place] Stroke Program Early CT Score) - Ganglionic level infarction (caudate, lentiform nuclei, internal capsule, insula, M1-M3 cortex): 7 - Supraganglionic infarction (M4-M6 cortex): 3 Total score (0-10 with 10 being normal): 10 IMPRESSION: 1. Normal head CT. 2. ASPECTS is 10. These results were called by telephone at the time of interpretation on 06/22/2016 at 9:02 pm to Dr. Ritta SlotMcNeill Kirkpatrick , who verbally acknowledged these results.  Electronically Signed   By: Deatra RobinsonKevin  Herman M.D.   On: 06/22/2016 21:04    Procedures Procedures (including critical care time)  Medications Ordered in ED Medications  prochlorperazine (COMPAZINE) injection 10 mg (10 mg Intravenous Given 06/22/16 2121)  diphenhydrAMINE (BENADRYL) injection 25 mg (25 mg Intravenous Given 06/22/16 2118)  sodium chloride 0.9 % bolus 1,000 mL (1,000 mLs Intravenous New Bag/Given 06/22/16 2120)  ketorolac (TORADOL) 15 MG/ML injection 15 mg (15 mg Intravenous Given 06/22/16 2123)     Initial Impression / Assessment and Plan / ED Course  I have reviewed the triage vital signs and the nursing notes.  Pertinent labs & imaging results that were available during my care of the patient were reviewed by me and considered in my medical decision making (see chart for details).  Clinical Course    Patient is a 30 year old male who presents with left-sided paresthesias as described above. Code stroke was initiated from triage. As this patient prior to transfer to the scanner and airway was intact. CT scan without contrast was obtained  and is negative for acute stroke. Patient was evaluated by neurology feels symptoms are more consistent with atypical migraine. Recommend migraine cocktail and reassessment. No further neurologic evaluation is necessary on an emergent basis if symptoms resolve. Patient was treated with IV fluids, Compazine, Benadryl, and Toradol. On reevaluation after these treatments patient reports his headache and paresthesias have resolved. He is tolerating po fluids and ambulated without difficulty. Discharged in stable condition. Will follow up with primary care doctor for re-evaluation. Headache precautions given. Patient and girlfriend report understanding and agreement with plan.  Discussed with Dr. Clarene DukeLittle, ED attending  Final Clinical Impressions(s) / ED Diagnoses   Final diagnoses:  Migraine without status migrainosus, not intractable, unspecified  migraine type    New Prescriptions New Prescriptions   No medications on file     Isa RankinAnn B Berda Shelvin, MD 06/22/16 2340    Laurence Spatesachel Morgan Little, MD 06/25/16 [phone]

## 2016-06-22 NOTE — ED Notes (Signed)
Called [facility] to page out code stroke

## 2016-06-22 NOTE — ED Notes (Signed)
Headache better med given iv

## 2016-06-22 NOTE — Progress Notes (Signed)
Code Stroke called on 30 y.o male. LSN 2015. Acute onset of numbness tingling down left arm and leg. Pt complains of seeing black spots in vision, right sided headache and photophobia. Pertinent history includes asthma and HTN. Pt states that he's had headaches that make him go lay down in a dark room from time to time. He also experienced a similar episode several years ago with left facial and going and numbness associated with a severe photophobic headache.CT scan completed and reviewed per Neurologist Dr. Amada JupiterKirkpatrick as negative for acute abnormalities. NIHSS completed yielding 0. Code Stroke canceled per MD. To be treated and observed in ED for migraine tonight.

## 2017-06-05 ENCOUNTER — Other Ambulatory Visit: Payer: Self-pay

## 2017-06-05 ENCOUNTER — Encounter (HOSPITAL_COMMUNITY): Payer: Self-pay | Admitting: Emergency Medicine

## 2017-06-05 ENCOUNTER — Emergency Department (HOSPITAL_COMMUNITY)
Admission: EM | Admit: 2017-06-05 | Discharge: 2017-06-05 | Disposition: A | Discharge status: Home / Self Care | Payer: Self-pay | Attending: Emergency Medicine | Admitting: Emergency Medicine

## 2017-06-05 ENCOUNTER — Emergency Department (HOSPITAL_COMMUNITY): Payer: Self-pay

## 2017-06-05 DIAGNOSIS — J069 Acute upper respiratory infection, unspecified: Secondary | ICD-10-CM | POA: Insufficient documentation

## 2017-06-05 DIAGNOSIS — F17210 Nicotine dependence, cigarettes, uncomplicated: Secondary | ICD-10-CM | POA: Insufficient documentation

## 2017-06-05 DIAGNOSIS — B349 Viral infection, unspecified: Secondary | ICD-10-CM | POA: Insufficient documentation

## 2017-06-05 DIAGNOSIS — I10 Essential (primary) hypertension: Secondary | ICD-10-CM | POA: Insufficient documentation

## 2017-06-05 DIAGNOSIS — J4541 Moderate persistent asthma with (acute) exacerbation: Secondary | ICD-10-CM | POA: Insufficient documentation

## 2017-06-05 MED ORDER — ALBUTEROL SULFATE HFA 108 (90 BASE) MCG/ACT IN AERS
2.0000 | INHALATION_SPRAY | RESPIRATORY_TRACT | Status: DC | PRN
  Administered 2017-06-05: 2 via RESPIRATORY_TRACT
  Filled 2017-06-05: qty 6.7

## 2017-06-05 MED ORDER — ALBUTEROL SULFATE (2.5 MG/3ML) 0.083% IN NEBU
INHALATION_SOLUTION | RESPIRATORY_TRACT | Status: AC
  Administered 2017-06-05: 5 mg via RESPIRATORY_TRACT
  Filled 2017-06-05: qty 6

## 2017-06-05 MED ORDER — ALBUTEROL SULFATE (2.5 MG/3ML) 0.083% IN NEBU
5.0000 mg | INHALATION_SOLUTION | Freq: Once | RESPIRATORY_TRACT | Status: AC
Start: 2017-06-05 — End: 2017-06-05
  Administered 2017-06-05: 5 mg via RESPIRATORY_TRACT

## 2017-06-05 MED ORDER — PREDNISONE 10 MG PO TABS: 20.0000 mg | ORAL_TABLET | Freq: Two times a day (BID) | ORAL | 0 refills | Status: AC

## 2017-06-05 NOTE — ED Triage Notes (Signed)
Patient reports SOB with chest congestion onset last night , denies cough , no fever or chills.

## 2017-06-05 NOTE — ED Provider Notes (Signed)
[facility]   CSN: [id number] Arrival date & time: 06/05/17  0456     History   Chief Complaint Chief Complaint  Patient presents with  . Shortness of Breath    HPI Thomas Harrell is a 31 y.o. male.  Patient is a [age] year old male with past medical history of asthma presenting for evaluation of shortness of breath.  This is been ongoing for the past several days.  It is worse when he wakes in the morning.  He does report some cough and some discomfort in his chest with coughing.  He denies any productive cough, fevers, or chills.  He does smoke cigarettes.   The history is provided by the patient.  Shortness of Breath  This is a new problem. The average episode lasts 2 days. The problem occurs intermittently.The problem has been gradually worsening. Associated symptoms include cough. Pertinent negatives include no fever and no sputum production. He has tried nothing for the symptoms. Associated medical issues include asthma.    Past Medical History:  Diagnosis Date  . Asthma   . Hypertension   . Wrist sprain     There are no active problems to display for this patient.   Past Surgical History:  Procedure Laterality Date  . MOUTH SURGERY         Home Medications    Prior to Admission medications   Medication Sig Start Date End Date Taking? Authorizing Provider  ibuprofen (ADVIL,MOTRIN) 800 MG tablet Take 1 tablet (800 mg total) by mouth 3 (three) times daily. 04/15/16   Gilda CreasePollina, Christopher J, MD  traMADol (ULTRAM) 50 MG tablet Take 1 tablet (50 mg total) by mouth every 6 (six) hours as needed. 04/15/16   Gilda CreasePollina, Christopher J, MD    Family History No family history on file.  Social History Social History   Tobacco Use  . Smoking status: Current Every Day Smoker    Packs/day: 0.00    Types: Cigarettes  . Smokeless tobacco: Never Used  Substance Use Topics  . Alcohol use: Yes  . Drug use: No      Allergies   Patient has no known allergies.   Review of Systems Review of Systems  Constitutional: Negative for fever.  Respiratory: Positive for cough and shortness of breath. Negative for sputum production.   All other systems reviewed and are negative.    Physical Exam Updated Vital Signs BP (!) 139/94 (BP Location: Left Arm)   Pulse (!) 106   Temp 97.7 F (36.5 C) (Oral)   Resp 18   SpO2 95%   Physical Exam  Constitutional: He is oriented to person, place, and time. He appears well-developed and well-nourished. No distress.  HENT:  Head: Normocephalic and atraumatic.  Mouth/Throat: Oropharynx is clear and moist.  Neck: Normal range of motion. Neck supple.  Cardiovascular: Normal rate and regular rhythm. Exam reveals no friction rub.  No murmur heard. Pulmonary/Chest: Effort normal and breath sounds normal. No respiratory distress. He has no wheezes. He has no rales.  Abdominal: Soft. Bowel sounds are normal. He exhibits no distension. There is no tenderness.  Musculoskeletal: Normal range of motion. He exhibits no edema.  Neurological: He is alert and oriented to person, place, and time. Coordination normal.  Skin: Skin is warm and dry. He is not diaphoretic.  Nursing note and vitals reviewed.    ED Treatments / Results  Labs (all labs ordered are listed, but only abnormal results are displayed) Labs  Reviewed - No data to display  EKG  EKG Interpretation None       Radiology Dg Chest 2 View  Result Date: 06/05/2017 CLINICAL DATA:  Shortness of breath and chest congestion since last night. EXAM: CHEST  2 VIEW COMPARISON:  04/15/2016 FINDINGS: The heart size and mediastinal contours are within normal limits. Both lungs are clear. The visualized skeletal structures are unremarkable. IMPRESSION: No active cardiopulmonary disease. Electronically Signed   By: Burman NievesWilliam  Stevens M.D.   On: 06/05/2017 05:44    Procedures Procedures (including critical care  time)  Medications Ordered in ED Medications  albuterol (PROVENTIL HFA;VENTOLIN HFA) 108 (90 Base) MCG/ACT inhaler 2 puff (not administered)  albuterol (PROVENTIL) (2.5 MG/3ML) 0.083% nebulizer solution 5 mg (5 mg Nebulization Given 06/05/17 0509)     Initial Impression / Assessment and Plan / ED Course  I have reviewed the triage vital signs and the nursing notes.  Pertinent labs & imaging results that were available during my care of the patient were reviewed by me and considered in my medical decision making (see chart for details).  Chest x-ray is clear and breath sounds are without significant wheezing.  He is feeling better after a nebulizer treatment.  I suspect a URI with exacerbation of his asthma.  This will be treated with prednisone, albuterol MDI, and follow-up as needed.  Final Clinical Impressions(s) / ED Diagnoses   Final diagnoses:  None    ED Discharge Orders    None       Geoffery Lyonselo, Jacqueleen Pulver, MD 06/05/17 [phone]

## 2017-06-05 NOTE — ED Notes (Signed)
Pt A&OX4, ambulatory at d/c with independent steady gait, NAD and states he has all of his belongings with him at d/c. He also verbalizes understanding of discharge instructions and successful teachback of inhaler use performed with no questions.

## 2017-06-05 NOTE — Discharge Instructions (Signed)
Prednisone as prescribed.  Albuterol: 2 puffs from your inhaler every 4 hours as needed for wheezing.  Ibuprofen 600 mg every 6 hours as needed for pain or fever.  Return to the emergency department if your symptoms significantly worsen or change.

## 2017-08-04 ENCOUNTER — Emergency Department (HOSPITAL_COMMUNITY)
Admission: EM | Admit: 2017-08-04 | Discharge: 2017-08-04 | Disposition: A | Discharge status: Home / Self Care | Payer: Self-pay | Attending: Emergency Medicine | Admitting: Emergency Medicine

## 2017-08-04 ENCOUNTER — Emergency Department (HOSPITAL_COMMUNITY): Payer: Self-pay

## 2017-08-04 ENCOUNTER — Other Ambulatory Visit: Payer: Self-pay

## 2017-08-04 ENCOUNTER — Encounter (HOSPITAL_COMMUNITY): Payer: Self-pay | Admitting: Emergency Medicine

## 2017-08-04 DIAGNOSIS — M545 Low back pain, unspecified: Secondary | ICD-10-CM

## 2017-08-04 DIAGNOSIS — F17210 Nicotine dependence, cigarettes, uncomplicated: Secondary | ICD-10-CM | POA: Insufficient documentation

## 2017-08-04 DIAGNOSIS — J45909 Unspecified asthma, uncomplicated: Secondary | ICD-10-CM | POA: Insufficient documentation

## 2017-08-04 DIAGNOSIS — I10 Essential (primary) hypertension: Secondary | ICD-10-CM | POA: Insufficient documentation

## 2017-08-04 DIAGNOSIS — Z79899 Other long term (current) drug therapy: Secondary | ICD-10-CM | POA: Insufficient documentation

## 2017-08-04 LAB — URINALYSIS, ROUTINE W REFLEX MICROSCOPIC
Bilirubin Urine: NEGATIVE
GLUCOSE, UA: NEGATIVE mg/dL
HGB URINE DIPSTICK: NEGATIVE
KETONES UR: NEGATIVE mg/dL
Leukocytes, UA: NEGATIVE
Nitrite: NEGATIVE
PH: 5.5 (ref 5.0–8.0)
Protein, ur: NEGATIVE mg/dL

## 2017-08-04 MED ORDER — IBUPROFEN 800 MG PO TABS: 800.0000 mg | ORAL_TABLET | Freq: Three times a day (TID) | ORAL | 0 refills | Status: AC

## 2017-08-04 MED ORDER — IBUPROFEN 800 MG PO TABS
800.0000 mg | ORAL_TABLET | Freq: Once | ORAL | Status: AC
Start: 2017-08-04 — End: 2017-08-04
  Administered 2017-08-04: 800 mg via ORAL
  Filled 2017-08-04: qty 1

## 2017-08-04 MED ORDER — METHOCARBAMOL 500 MG PO TABS: 500.0000 mg | ORAL_TABLET | Freq: Two times a day (BID) | ORAL | 0 refills | Status: AC

## 2017-08-04 NOTE — ED Triage Notes (Signed)
Pt presents with R flank pain; denies urinary symptoms, abd pain, fever, or n/v/d

## 2017-08-04 NOTE — Discharge Instructions (Signed)
Return if any problems.

## 2017-08-04 NOTE — ED Provider Notes (Signed)
[facility] EMERGENCY DEPARTMENT Provider Note   CSN: [id number] Arrival date & time: 08/04/17  0545     History   Chief Complaint Chief Complaint  Patient presents with  . Flank Pain    HPI Thomas Harrell is a 32 y.o. male.  The history is provided by the patient. No language interpreter was used.  Flank Pain  This is a new problem. The current episode started 2 days ago. The problem occurs constantly. The problem has been gradually worsening. Pertinent negatives include no abdominal pain. Nothing aggravates the symptoms. Nothing relieves the symptoms. He has tried nothing for the symptoms. The treatment provided no relief.    Past Medical History:  Diagnosis Date  . Asthma   . Hypertension   . Wrist sprain     There are no active problems to display for this patient.   Past Surgical History:  Procedure Laterality Date  . MOUTH SURGERY         Home Medications    Prior to Admission medications   Medication Sig Start Date End Date Taking? Authorizing Provider  ibuprofen (ADVIL,MOTRIN) 800 MG tablet Take 1 tablet (800 mg total) by mouth 3 (three) times daily. 08/04/17   Elson Areas, PA-C  methocarbamol (ROBAXIN) 500 MG tablet Take 1 tablet (500 mg total) by mouth 2 (two) times daily. 08/04/17   Elson Areas, PA-C  predniSONE (DELTASONE) 10 MG tablet Take 2 tablets (20 mg total) 2 (two) times daily with a meal by mouth. 06/05/17   Geoffery Lyons, MD  traMADol (ULTRAM) 50 MG tablet Take 1 tablet (50 mg total) by mouth every 6 (six) hours as needed. 04/15/16   Gilda Crease, MD    Family History No family history on file.  Social History Social History   Tobacco Use  . Smoking status: Current Every Day Smoker    Packs/day: 0.00    Types: Cigarettes  . Smokeless tobacco: Never Used  Substance Use Topics  . Alcohol use: Yes  . Drug use: No     Allergies   Patient has no known allergies.   Review of Systems Review of Systems    Gastrointestinal: Negative for abdominal pain.  Genitourinary: Positive for flank pain.  All other systems reviewed and are negative.    Physical Exam Updated Vital Signs BP 110/63 (BP Location: Right Arm)   Pulse 73   Temp 97.8 F (36.6 C) (Oral)   Resp 18   Ht 5\' 8"  (1.727 m)   Wt 90.7 kg (200 lb)   SpO2 98%   BMI 30.41 kg/m   Physical Exam  Constitutional: He appears well-developed and well-nourished.  HENT:  Head: Normocephalic and atraumatic.  Eyes: Conjunctivae are normal.  Neck: Neck supple.  Cardiovascular: Normal rate and regular rhythm.  No murmur heard. Pulmonary/Chest: Effort normal and breath sounds normal. No respiratory distress.  Abdominal: Soft. There is no tenderness.  Musculoskeletal: He exhibits no edema.  ls spine  Diffusely tender,  From,  nv and ns intact  Neurological: He is alert.  Skin: Skin is warm and dry.  Psychiatric: He has a normal mood and affect.  Nursing note and vitals reviewed.    ED Treatments / Results  Labs (all labs ordered are listed, but only abnormal results are displayed) Labs Reviewed  URINALYSIS, ROUTINE W REFLEX MICROSCOPIC - Abnormal; Notable for the following components:      Result Value   Specific Gravity, Urine >1.030 (*)  All other components within normal limits    EKG  EKG Interpretation None       Radiology Dg Lumbar Spine Complete  Result Date: 08/04/2017 CLINICAL DATA:  Right-sided low back pain for the past 5 days. No injury. EXAM: LUMBAR SPINE - COMPLETE 4+ VIEW COMPARISON:  Lumbar spine x-rays dated April 27, 2014. FINDINGS: Five lumbar type vertebral bodies. No acute fracture or subluxation. Vertebral body heights are preserved. Straightening of the normal lumbar lordosis. Alignment is normal. Intervertebral disc spaces are maintained. IMPRESSION: Straightening of the normal lumbar lordosis, which could reflect muscle spasm. No acute osseous abnormality. Electronically Signed   By: Obie DredgeWilliam  T Derry M.D.   On: 08/04/2017 10:14    Procedures Procedures (including critical care time)  Medications Ordered in ED Medications  ibuprofen (ADVIL,MOTRIN) tablet 800 mg (800 mg Oral Given 08/04/17 1055)     Initial Impression / Assessment and Plan / ED Course  I have reviewed the triage vital signs and the nursing notes.  Pertinent labs & imaging results that were available during my care of the patient were reviewed by me and considered in my medical decision making (see chart for details).    The patient appears reasonably stabilized for admission considering the current resources, flow, and capabilities available in the ED at this time, and I doubt [facility] further screening and/or treatment in the ED prior to admission. Pt feels better after ibuprofen.  Pt counseled on back pain and rx's  Final Clinical Impressions(s) / ED Diagnoses   Final diagnoses:  Acute low back pain without sciatica, unspecified back pain laterality    ED Discharge Orders        Ordered    ibuprofen (ADVIL,MOTRIN) 800 MG tablet  3 times daily     08/04/17 1301    methocarbamol (ROBAXIN) 500 MG tablet  2 times daily     08/04/17 1301    An After Visit Summary was printed and given to the patient.    Osie CheeksSofia, Tonnie Stillman K, PA-C 08/04/17 1657    Benjiman CorePickering, Nathan, MD 08/04/17 2033

## 2017-08-04 NOTE — ED Triage Notes (Signed)
Pt falling asleep during triage assessment; needing frequent arousal to answer my questions

## 2017-08-04 NOTE — ED Notes (Signed)
Patient aware that a urine sample is needed 

## 2017-09-26 DEATH — deceased
